# Patient Record
Sex: Male | Born: 1980 | Race: Black or African American | Hispanic: No | Marital: Single | Smoking: Never smoker
Health system: Southern US, Community
[De-identification: ages and names within clinical notes are randomized; demographics above are authoritative.]

## PROBLEM LIST (undated history)

## (undated) DIAGNOSIS — H547 Unspecified visual loss: Secondary | ICD-10-CM

## (undated) DIAGNOSIS — I10 Essential (primary) hypertension: Secondary | ICD-10-CM

## (undated) DIAGNOSIS — E119 Type 2 diabetes mellitus without complications: Secondary | ICD-10-CM

## (undated) HISTORY — PX: EYE SURGERY: SHX253

---

## 2017-12-24 ENCOUNTER — Emergency Department (HOSPITAL_BASED_OUTPATIENT_CLINIC_OR_DEPARTMENT_OTHER)
Admit: 2017-12-24 | Discharge: 2017-12-24 | Disposition: A | Discharge status: Home / Self Care | Payer: BLUE CROSS/BLUE SHIELD | Attending: Emergency Medicine | Admitting: Emergency Medicine

## 2017-12-24 ENCOUNTER — Encounter (HOSPITAL_COMMUNITY): Payer: Self-pay

## 2017-12-24 ENCOUNTER — Emergency Department (HOSPITAL_COMMUNITY)
Admission: EM | Admit: 2017-12-24 | Discharge: 2017-12-24 | Disposition: A | Discharge status: Home / Self Care | Payer: BLUE CROSS/BLUE SHIELD | Attending: Emergency Medicine | Admitting: Emergency Medicine

## 2017-12-24 ENCOUNTER — Emergency Department (HOSPITAL_COMMUNITY): Payer: BLUE CROSS/BLUE SHIELD

## 2017-12-24 DIAGNOSIS — R609 Edema, unspecified: Secondary | ICD-10-CM

## 2017-12-24 DIAGNOSIS — R2241 Localized swelling, mass and lump, right lower limb: Secondary | ICD-10-CM | POA: Insufficient documentation

## 2017-12-24 DIAGNOSIS — M79609 Pain in unspecified limb: Secondary | ICD-10-CM

## 2017-12-24 DIAGNOSIS — L03116 Cellulitis of left lower limb: Secondary | ICD-10-CM

## 2017-12-24 DIAGNOSIS — L03115 Cellulitis of right lower limb: Secondary | ICD-10-CM | POA: Insufficient documentation

## 2017-12-24 DIAGNOSIS — M25561 Pain in right knee: Secondary | ICD-10-CM | POA: Diagnosis present

## 2017-12-24 DIAGNOSIS — Z794 Long term (current) use of insulin: Secondary | ICD-10-CM | POA: Diagnosis not present

## 2017-12-24 DIAGNOSIS — E1165 Type 2 diabetes mellitus with hyperglycemia: Secondary | ICD-10-CM | POA: Diagnosis not present

## 2017-12-24 DIAGNOSIS — R739 Hyperglycemia, unspecified: Secondary | ICD-10-CM

## 2017-12-24 HISTORY — DX: Type 2 diabetes mellitus without complications: E11.9

## 2017-12-24 HISTORY — DX: Unspecified visual loss: H54.7

## 2017-12-24 LAB — BASIC METABOLIC PANEL
Anion gap: 13 (ref 5–15)
BUN: 15 mg/dL (ref 6–20)
CO2: 21 mmol/L — ABNORMAL LOW (ref 22–32)
Calcium: 10 mg/dL (ref 8.9–10.3)
Chloride: 100 mmol/L — ABNORMAL LOW (ref 101–111)
Creatinine, Ser: 1.2 mg/dL (ref 0.61–1.24)
GFR calc Af Amer: >60 mL/min (ref >60)
GFR calc non Af Amer: >60 mL/min (ref >60)
Glucose, Bld: 385 mg/dL — ABNORMAL HIGH (ref 65–99)
Potassium: 3.9 mmol/L (ref 3.5–5.1)
Sodium: 134 mmol/L — ABNORMAL LOW (ref 135–145)

## 2017-12-24 LAB — CBC WITH DIFFERENTIAL/PLATELET
Basophils Absolute: 0 10*3/uL (ref 0.0–0.1)
Basophils Relative: 0 %
Eosinophils Absolute: 0.1 10*3/uL (ref 0.0–0.7)
Eosinophils Relative: 1 %
HCT: 43.3 % (ref 39.0–52.0)
Hemoglobin: 14.4 g/dL (ref 13.0–17.0)
Lymphocytes Relative: 18 %
Lymphs Abs: 2.7 10*3/uL (ref 0.7–4.0)
MCH: 28 pg (ref 26.0–34.0)
MCHC: 33.3 g/dL (ref 30.0–36.0)
MCV: 84.2 fL (ref 78.0–100.0)
Monocytes Absolute: 0.7 10*3/uL (ref 0.1–1.0)
Monocytes Relative: 5 %
Neutro Abs: 11.1 10*3/uL — ABNORMAL HIGH (ref 1.7–7.7)
Neutrophils Relative %: 76 %
Platelets: 275 10*3/uL (ref 150–400)
RBC: 5.14 MIL/uL (ref 4.22–5.81)
RDW: 12 % (ref 11.5–15.5)
WBC: 14.6 10*3/uL — ABNORMAL HIGH (ref 4.0–10.5)

## 2017-12-24 LAB — CBG MONITORING, ED: Glucose-Capillary: 333 mg/dL — ABNORMAL HIGH (ref 65–99)

## 2017-12-24 LAB — I-STAT CG4 LACTIC ACID, ED: Lactic Acid, Venous: 1.44 mmol/L (ref 0.5–1.9)

## 2017-12-24 MED ORDER — SODIUM CHLORIDE 0.9 % IV BOLUS (SEPSIS)
1000.0000 mL | Freq: Once | INTRAVENOUS | Status: AC
  Administered 2017-12-24: 1000 mL via INTRAVENOUS

## 2017-12-24 MED ORDER — INSULIN GLARGINE 100 UNIT/ML ~~LOC~~ SOLN: 65.0000 [IU] | Freq: Every day | SUBCUTANEOUS | 11 refills | Status: AC

## 2017-12-24 MED ORDER — PRODIGY AUTOCODE BLOOD GLUCOSE W/DEVICE KIT: 1.0000 | PACK | Freq: Every day | 0 refills | Status: DC

## 2017-12-24 MED ORDER — DOXYCYCLINE HYCLATE 100 MG PO TABS
100.0000 mg | ORAL_TABLET | Freq: Once | ORAL | Status: AC
  Administered 2017-12-24: 100 mg via ORAL
  Filled 2017-12-24: qty 1

## 2017-12-24 MED ORDER — PRODIGY LANCETS 28G MISC: 0 refills | Status: DC

## 2017-12-24 MED ORDER — DOXYCYCLINE HYCLATE 100 MG PO CAPS: 100.0000 mg | ORAL_CAPSULE | Freq: Two times a day (BID) | ORAL | 0 refills | Status: DC

## 2017-12-24 NOTE — ED Triage Notes (Signed)
PT in X-Ray Dept

## 2017-12-24 NOTE — Discharge Instructions (Signed)
Please take all of your antibiotics until finished!   You may develop abdominal discomfort or diarrhea from the antibiotic.  You may help offset this with probiotics which you can buy or get in yogurt. Do not eat  or take the probiotics until 2 hours after your antibiotic.   You may alternate ibuprofen and Tylenol every 4 hours as needed for pain.  I have refilled your Lantus and given you a prescription for a Prodigy talking glucometer kit as well as lancets.  Establish care with a primary care physician as soon as possible for evaluation and management of your diabetes.  Return to the emergency department any concerning signs or symptoms develop such as fevers, worsening pain and swelling, or inability to move the knee.

## 2017-12-24 NOTE — ED Notes (Signed)
Declined W/C at D/C and was escorted to lobby by RN. 

## 2017-12-24 NOTE — ED Provider Notes (Signed)
[name] EMERGENCY DEPARTMENT Provider Note   CSN: [id number] Arrival date & time: 12/24/17  0815     History   Chief Complaint Chief Complaint  Patient presents with  . Leg Pain    HPI Cory Mullins is a 37 y.o. male with history of diabetes and blindness presents today for evaluation of acute onset, progressively worsening right knee pain for 1 week.  He states he noticed a "knot "to the lateral aspect of his right knee which intermittently drained fluid.  He notes swelling to the knee extending down to his ankle.  Denies ankle pain.  Pain in the knee is intermittent, aching in nature and worsens when he attempts to ambulate or leaves his knee in a flexed position for too long.  He does note subjective fevers and chills but did not take his temperature at home.  He is supposed to be on Lantus but has not had this medication for over 3 months since moving down to [place].  He does endorse polydipsia and polyuria.  He denies chest pain or shortness of breath.  No orthopnea.  Denies any recent trauma or falls.  The history is provided by the patient.    Past Medical History:  Diagnosis Date  . Blind   . Diabetes mellitus without complication ([place])     There are no active problems to display for this patient.   Past Surgical History:  Procedure Laterality Date  . EYE SURGERY         Home Medications    Prior to Admission medications   Medication Sig Start Date End Date Taking? Authorizing Provider  Blood Glucose Monitoring Suppl (PRODIGY AUTOCODE BLOOD GLUCOSE) w/Device KIT 1 Device by Does not apply route daily. 12/24/17   Sahaj Bona A, PA-C  doxycycline (VIBRAMYCIN) 100 MG capsule Take 1 capsule (100 mg total) by mouth 2 (two) times daily for 10 days. 12/24/17 01/03/18  Rodell Perna A, PA-C  insulin glargine (LANTUS) 100 UNIT/ML injection Inject 0.65 mLs (65 Units total) into the skin at bedtime. 12/24/17   Nils Flack, [place], PA-C  PRODIGY LANCETS 28G  MISC Use lancets to check blood glucose daily 12/24/17   Renita Papa, PA-C    Family History No family history on file.  Social History Social History   Tobacco Use  . Smoking status: Never Smoker  . Smokeless tobacco: Never Used  Substance Use Topics  . Alcohol use: No    Frequency: Never  . Drug use: No     Allergies   Patient has no known allergies.   Review of Systems Review of Systems  Constitutional: Positive for chills and fever.  Respiratory: Negative for shortness of breath.   Cardiovascular: Negative for chest pain.  Genitourinary: Negative for discharge, hematuria, penile pain, penile swelling, scrotal swelling, testicular pain and urgency.  Musculoskeletal: Positive for arthralgias.  Skin: Positive for wound.  Neurological: Negative for weakness and numbness.     Physical Exam Updated Vital Signs BP (!) 141/77 (BP Location: Right Arm)   Pulse 89   Temp 98.4 F (36.9 C) (Oral)   Resp 16   SpO2 100%   Physical Exam  Constitutional: He is oriented to person, place, and time. He appears well-developed and well-nourished. No distress.  Resting in chair, wearing dark sunglasses  HENT:  Head: Normocephalic and atraumatic.  Eyes: Conjunctivae are normal. Right eye exhibits no discharge. Left eye exhibits no discharge.  Neck: No JVD present. No tracheal deviation present.  Cardiovascular: Normal rate.  2+ DP/PT pulses bilaterally, 1+ nonpitting edema of the right lower extremity extending from the ankle up into the knee.  No calf tenderness, no palpable cords.  Right calf measures 44 cm at widest point as compared to 40 cm on left calf.  Pulmonary/Chest: Effort normal.  Abdominal: He exhibits no distension.  Musculoskeletal: Normal range of motion. He exhibits tenderness. He exhibits no edema.  Normal passive range of motion of the right knee.  There is mild swelling noted anteriorly.  There is a 1 cm nodule to the lateral aspect of the right knee with some  surrounding induration and mild erythema.  No drainage from this area.  This nodule is tender to palpation extending upwards into the lateral aspect of the thigh. No warmth noted to the joint.  Negative anterior/posterior drawer test.  No varus or valgus instability.  5/5 strength of BLE major muscle groups.  Neurological: He is alert and oriented to person, place, and time. No sensory deficit. He exhibits normal muscle tone.  Fluent speech, no facial droop, sensation intact to soft touch of extremities, initially ambulate with a mildly antalgic gait but then becomes normal, exhibits good balance, and patient able to heel walk and toe walk without difficulty.   Skin: Skin is warm and dry. No erythema.  Psychiatric: He has a normal mood and affect. His behavior is normal.  Nursing note and vitals reviewed.    ED Treatments / Results  Labs (all labs ordered are listed, but only abnormal results are displayed) Labs Reviewed  BASIC METABOLIC PANEL - Abnormal; Notable for the following components:      Result Value   Sodium 134 (*)    Chloride 100 (*)    CO2 21 (*)    Glucose, Bld 385 (*)    All other components within normal limits  CBC WITH DIFFERENTIAL/PLATELET - Abnormal; Notable for the following components:   WBC 14.6 (*)    Neutro Abs 11.1 (*)    All other components within normal limits  CBG MONITORING, ED - Abnormal; Notable for the following components:   Glucose-Capillary 333 (*)    All other components within normal limits  I-STAT CG4 LACTIC ACID, ED  I-STAT CG4 LACTIC ACID, ED    EKG  EKG Interpretation None       Radiology Dg Knee Complete 4 Views Right  Result Date: 12/24/2017 CLINICAL DATA:  Knee pain for 1-2 weeks. EXAM: RIGHT KNEE - COMPLETE 4+ VIEW COMPARISON:  None. FINDINGS: No fracture, dislocation, or joint effusion.  No bony erosion. IMPRESSION: Negative. Electronically Signed   By: Dorise Bullion III M.D   On: 12/24/2017 12:00     Procedures Procedures (including critical care time) EMERGENCY DEPARTMENT US SOFT TISSUE INTERPRETATION "Study: Limited Soft Tissue Ultrasound"  INDICATIONS: Pain and Soft tissue infection Multiple views of the body part were obtained in real-time with a multi-frequency linear probe  PERFORMED BY: Myself IMAGES ARCHIVED?: Yes SIDE:Right  BODY PART:Lower extremity INTERPRETATION:  Cellulitis present    Medications Ordered in ED Medications  sodium chloride 0.9 % bolus 1,000 mL (0 mLs Intravenous Stopped 12/24/17 1425)  doxycycline (VIBRA-TABS) tablet 100 mg (100 mg Oral Given 12/24/17 1425)     Initial Impression / Assessment and Plan / ED Course  I have reviewed the triage vital signs and the nursing notes.  Pertinent labs & imaging results that were available during my care of the patient were reviewed by me and considered in my medical  decision making (see chart for details).     Patient presents with complaint of pain to the lateral aspect of the right knee with some surrounding induration and lower extremity edema.  He is afebrile but tachycardic and hypertensive in the ED with improvement throughout his stay in the ED.  On examination he has nonpitting edema of the right lower extremity and a firm nodule to the lateral aspect of the right knee.  He has good passive range of motion of the knee with no erythema or warmth and I doubt septic joint or gout.  He is neurovascularly intact.  Radiographs show no evidence of acute osseous abnormality or severe swelling, no evidence of osteomyelitis.  Bedside ultrasound shows cobblestoning suggestive of cellulitis but no definitive fluid collection amenable to I&D.  He is ambulatory without difficulty.  DVT study was completed with no evidence of DVT but some inguinal lymphadenopathy.  Patient denies any pain or swelling to the scrotum/penis or any other pelvic complaints.  I doubt Fournier's gangrene.  He is diabetic and has not been on  his Lantus for 3 months since moving to [place] state.  He does state that he used to own a glucometer that would read out his blood sugar reading but left this in the other state.  Lactate is normal and he does not appear to be septic at this time.  He was hyperglycemic with a blood glucose of 385 and was given a liter of IV fluids.  No evidence of DKA and anion gap is normal.  He does have a leukocytosis with elevation in absolute neutrophils.    I suspect he has a cellulitis from what was initially an abscess that has since drained.  Will discharge with doxycycline.  We will also refill his Lantus and give him a prescription for the glucometer that can speak out his blood glucose readings.  He states that he is in the process of obtaining a PCP here.  I encouraged him to contact his insurance company and establish care with a PCP as soon as possible.  Discussed strict ED return precautions.  Patient and patient's fianc verbalized understanding of and agreement with plan and patient is stable for discharge home at this time.  Final Clinical Impressions(s) / ED Diagnoses   Final diagnoses:  Left leg cellulitis  Hyperglycemia    ED Discharge Orders        Ordered    doxycycline (VIBRAMYCIN) 100 MG capsule  2 times daily     12/24/17 1416    Blood Glucose Monitoring Suppl (PRODIGY AUTOCODE BLOOD GLUCOSE) w/Device KIT  Daily     12/24/17 1416    PRODIGY LANCETS 28G MISC     12/24/17 1416    insulin glargine (LANTUS) 100 UNIT/ML injection  Daily at bedtime     12/24/17 1416       Renita Papa, PA-C 12/24/17 1639    Julianne Rice, MD 12/25/17 [phone]

## 2017-12-24 NOTE — ED Triage Notes (Signed)
CBG 333. °

## 2017-12-24 NOTE — ED Triage Notes (Signed)
Pt reports swelling to right ankles and abscess to right knee x several weeks. Denies pain to ankle.

## 2017-12-24 NOTE — ED Triage Notes (Signed)
PT gone to US.

## 2017-12-24 NOTE — Progress Notes (Signed)
VASCULAR LAB PRELIMINARY  PRELIMINARY  PRELIMINARY  PRELIMINARY  Right lower extremity venous duplex completed.    Preliminary report:  There is no DVT or SVT noted in the right lower extremity.  Enlarged inguinal lymph nodes noted, bilaterally.   Gave report to Arthor CaptainAbigail Harris, PA-C  Serayah Yazdani, RVT 12/24/2017, 12:19 PM

## 2017-12-28 ENCOUNTER — Encounter (HOSPITAL_COMMUNITY): Payer: Self-pay | Admitting: *Deleted

## 2017-12-28 ENCOUNTER — Inpatient Hospital Stay (HOSPITAL_COMMUNITY)
Admission: EM | Admit: 2017-12-28 | Discharge: 2018-01-01 | DRG: 603 | Disposition: A | Discharge status: Home / Self Care | Payer: BLUE CROSS/BLUE SHIELD | Attending: Family Medicine | Admitting: Family Medicine

## 2017-12-28 DIAGNOSIS — Z79899 Other long term (current) drug therapy: Secondary | ICD-10-CM

## 2017-12-28 DIAGNOSIS — Z833 Family history of diabetes mellitus: Secondary | ICD-10-CM

## 2017-12-28 DIAGNOSIS — D649 Anemia, unspecified: Secondary | ICD-10-CM | POA: Diagnosis present

## 2017-12-28 DIAGNOSIS — E1165 Type 2 diabetes mellitus with hyperglycemia: Secondary | ICD-10-CM | POA: Diagnosis present

## 2017-12-28 DIAGNOSIS — Z794 Long term (current) use of insulin: Secondary | ICD-10-CM

## 2017-12-28 DIAGNOSIS — H547 Unspecified visual loss: Secondary | ICD-10-CM

## 2017-12-28 DIAGNOSIS — L03115 Cellulitis of right lower limb: Secondary | ICD-10-CM | POA: Diagnosis present

## 2017-12-28 DIAGNOSIS — L02415 Cutaneous abscess of right lower limb: Secondary | ICD-10-CM | POA: Diagnosis not present

## 2017-12-28 DIAGNOSIS — R739 Hyperglycemia, unspecified: Secondary | ICD-10-CM

## 2017-12-28 LAB — CBG MONITORING, ED: GLUCOSE-CAPILLARY: 323 mg/dL — AB (ref 65–99)

## 2017-12-28 MED ORDER — LIDOCAINE-EPINEPHRINE 2 %-1:200000 IJ SOLN
20.0000 mL | Freq: Once | INTRAMUSCULAR | Status: AC
Start: 2017-12-28 — End: 2017-12-29
  Administered 2017-12-29: 20 mL
  Filled 2017-12-28: qty 20

## 2017-12-28 MED ORDER — SODIUM CHLORIDE 0.9 % IV BOLUS (SEPSIS)
1000.0000 mL | Freq: Once | INTRAVENOUS | Status: AC
  Administered 2017-12-29: 1000 mL via INTRAVENOUS

## 2017-12-28 NOTE — ED Triage Notes (Signed)
To ED for re-eval of right knee abscess- seen on Sunday for same and given Doxy. States the pain is better and the swelling is better. Didn't have to lance due to draining. Pt also states he was prescribed insulin that needed to be drawn up instead of a prefilled pen. Pt is blind and unable to draw but is able to use pen by sound.

## 2017-12-28 NOTE — ED Provider Notes (Signed)
[facility] EMERGENCY DEPARTMENT Provider Note   CSN: [id number] Arrival date & time: 12/28/17  1739     History   Chief Complaint Chief Complaint  Patient presents with  . Knee Pain    HPI Cory Mullins is a 37 y.o. 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  Per record review, patient was evaluated on 12/24/2017 for this and found to have an area of cellulitis but no fluid collection for which I&D would be amenable.  He was given fluids in the emergency department and was found to have no evidence of DKA.  Additionally, he had no evidence of DVT on venous ultrasound.  He was discharged home with doxycycline and a prescription for insulin.  Patient reports that he was given Lantus vials and not the pen which is difficult for him to use due to his blindness.  He reports that he has had difficulty with his glucometer and therefore has not been checking his blood sugar recently.  Patient endorses intermittent chills at home which have improved since getting the antibiotic but have not resolved.  He has not measured his temperature at home.  Patient reports he has pain and "stiffness" when attempting to bend his right knee.  He has been using warm compresses at home and states there has been some persistent drainage for the last several days.  Also continues to endorse some polydipsia and some polyuria.   The history is provided by the patient, medical records and a significant other. No language interpreter was used.    Past Medical History:  Diagnosis Date  . Blind   . Diabetes mellitus without complication ([place])     There are no active problems to display for this patient.   Past Surgical History:  Procedure Laterality Date  . EYE SURGERY         Home Medications    Prior to Admission medications   Medication Sig Start Date  End Date Taking? Authorizing Provider  doxycycline (VIBRAMYCIN) 100 MG capsule Take 1 capsule (100 mg total) by mouth 2 (two) times daily for 10 days. 12/24/17 01/03/18 Yes Fawze, Mina A, PA-C  insulin glargine (LANTUS) 100 UNIT/ML injection Inject 0.65 mLs (65 Units total) into the skin at bedtime. 12/24/17  Yes Fawze, Mina A, PA-C  Blood Glucose Monitoring Suppl (PRODIGY AUTOCODE BLOOD GLUCOSE) w/Device KIT 1 Device by Does not apply route daily. 12/24/17   Nils Flack, [place], PA-C  PRODIGY LANCETS 28G MISC Use lancets to check blood glucose daily 12/24/17   Renita Papa, PA-C    Family History No family history on file.  Social History Social History   Tobacco Use  . Smoking status: Never Smoker  . Smokeless tobacco: Never Used  Substance Use Topics  . Alcohol use: No    Frequency: Never  . Drug use: No     Allergies   Patient has no known allergies.   Review of Systems Review of Systems  Constitutional: Positive for chills and fatigue. Negative for appetite change, diaphoresis, fever ( Patient has not attempted to measure at home) and unexpected weight change.  HENT: Negative for mouth sores.   Eyes: Negative for visual disturbance.  Respiratory: Negative for cough, chest tightness, shortness of breath and wheezing.   Cardiovascular: Negative for chest pain.  Gastrointestinal: Negative for abdominal pain, constipation, diarrhea, nausea and vomiting.  Endocrine: Positive for  polydipsia and polyuria. Negative for polyphagia.  Genitourinary: Negative for dysuria, frequency, hematuria and urgency.  Musculoskeletal: Positive for joint swelling ( Right knee). Negative for back pain and neck stiffness.  Skin: Positive for color change and wound. Negative for rash.  Allergic/Immunologic: Negative for immunocompromised state.  Neurological: Negative for syncope, light-headedness and headaches.  Hematological: Does not bruise/bleed easily.  Psychiatric/Behavioral: Negative for sleep  disturbance. The patient is not nervous/anxious.      Physical Exam Updated Vital Signs BP 133/89 (BP Location: Left Arm)   Pulse (!) 106   Temp 98.9 F (37.2 C) (Oral)   Resp 16   SpO2 100%   Physical Exam  Constitutional: He appears well-developed and well-nourished. No distress.  Awake, alert, nontoxic appearance  HENT:  Head: Normocephalic and atraumatic.  Mouth/Throat: Oropharynx is clear and moist. No oropharyngeal exudate.  Eyes: Conjunctivae are normal. No scleral icterus.  Neck: Normal range of motion. Neck supple.  Cardiovascular: Regular rhythm and intact distal pulses. Tachycardia present.  Pulses:      Radial pulses are 2+ on the right side, and 2+ on the left side.       Dorsalis pedis pulses are 2+ on the right side.  Pulmonary/Chest: Effort normal and breath sounds normal. No respiratory distress. He has no wheezes.  Equal chest expansion  Abdominal: Soft. Bowel sounds are normal. He exhibits no mass. There is no tenderness. There is no rebound and no guarding.  Musculoskeletal: Normal range of motion. He exhibits no edema.  Right knee with global edema, no circumferential warmth or redness.  No tenderness to palpation along the medial or posterior portion of the knee.  Patient able to slowly flex to approximately 90 degrees but unable to go further due to some pain and feeling of tightness.  Popliteal fossa is soft.  Neurological: He is alert.  Speech is clear and goal oriented Moves extremities without ataxia  Skin: Skin is warm and dry. He is not diaphoretic.  Large area of erythema and induration to the lateral portion of the right knee with central fluctuance and persistent purulent drainage.  Tenderness to palpation throughout.  Psychiatric: He has a normal mood and affect.  Nursing note and vitals reviewed.      ED Treatments / Results  Labs (all labs ordered are listed, but only abnormal results are displayed) Labs Reviewed  CBC WITH  DIFFERENTIAL/PLATELET - Abnormal; Notable for the following components:      Result Value   WBC 17.6 (*)    RBC 4.21 (*)    Hemoglobin 11.9 (*)    HCT 35.7 (*)    Neutro Abs 13.7 (*)    All other components within normal limits  BASIC METABOLIC PANEL - Abnormal; Notable for the following components:   Sodium 134 (*)    CO2 21 (*)    Glucose, Bld 414 (*)    All other components within normal limits  CBG MONITORING, ED - Abnormal; Notable for the following components:   Glucose-Capillary 323 (*)    All other components within normal limits  CBG MONITORING, ED - Abnormal; Notable for the following components:   Glucose-Capillary 293 (*)    All other components within normal limits  I-STAT CG4 LACTIC ACID, ED  I-STAT CG4 LACTIC ACID, ED    Procedures .Marland KitchenIncision and Drainage Date/Time: 12/29/2017 1:15 AM Performed by: Abigail Butts, PA-C Authorized by: Abigail Butts, PA-C   Consent:    Consent obtained:  Verbal   Consent given by:  Patient   Risks discussed:  Bleeding, incomplete drainage and infection   Alternatives discussed:  No treatment Location:    Type:  Abscess   Location:  Lower extremity   Lower extremity location:  Knee   Knee location:  R knee Pre-procedure details:    Skin preparation:  Betadine Anesthesia (see MAR for exact dosages):    Anesthesia method:  Local infiltration   Local anesthetic:  Lidocaine 2% WITH epi (50m) Procedure type:    Complexity:  Complex Procedure details:    Incision types:  Single straight   Incision depth:  Dermal   Scalpel blade:  11   Wound management:  Probed and deloculated, irrigated with saline and extensive cleaning   Drainage:  Bloody and purulent   Drainage amount:  Moderate   Wound treatment:  Wound left open   Packing materials:  None Post-procedure details:    Patient tolerance of procedure:  Tolerated well, no immediate complications   (including critical care time)  EMERGENCY DEPARTMENT [place]  TISSUE INTERPRETATION "Study: Limited Soft Tissue Ultrasound"  INDICATIONS: Pain and Soft tissue infection Multiple views of the body part were obtained in real-time with a multi-frequency linear probe  PERFORMED BY: Myself IMAGES ARCHIVED?: Yes SIDE:Right  BODY PART:right knee INTERPRETATION:  Abcess present and Cellulitis present     Medications Ordered in ED Medications  ceFAZolin (ANCEF) IVPB 1 g/50 mL premix (1 g Intravenous New Bag/Given 12/29/17 0156)  sodium chloride 0.9 % bolus 1,000 mL (0 mLs Intravenous Stopped 12/29/17 0150)  lidocaine-EPINEPHrine (XYLOCAINE W/EPI) 2 %-1:200000 (PF) injection 20 mL (20 mLs Infiltration Given 12/29/17 0009)  HYDROcodone-acetaminophen (NORCO/VICODIN) 5-325 MG per tablet 2 tablet (2 tablets Oral Given 12/29/17 0155)     Initial Impression / Assessment and Plan / ED Course  I have reviewed the triage vital signs and the nursing notes.  Pertinent labs & imaging results that were available during my care of the patient were reviewed by me and considered in my medical decision making (see chart for details).  Clinical Course as of Dec 29 205  Thu Dec 28, 2017  2339 Mild tachycardia  Pulse Rate(!): 106 [HM]  2339 Pt checked his CBG on his home machine during exam with a reading of 502   [HM]  Fri Dec 29, 2017  0117 Normal Lactic acid.  No evidence of sepsis  Lactic Acid, Venous: 1.05 [HM]  0126 Increased since last visit  WBC(!): 17.6 [HM]  0126 Decrease since last visit.  Patient denies known bleeding.  Hemoglobin(!): 11.9 [HM]  0127 Improved slightly.  Prior to fluids  Glucose(!): 414 [HM]  0206 Discussed with Dr. KHal Hopewho will admit   [HM]    Clinical Course User Index [HM] Gwenn Teodoro, HGwenlyn Perking   Presents with right knee abscess.  Patient has been at home with doxycycline for the last 4 days with questionable improvement.  He is blind and has been struggling with wound care and blood sugars at home along with  insulin administration.  Incision and drainage at bedside with moderate amount of purulent drainage.  Tachycardic on arrival but afebrile.  Suspect this is secondary to elevated glucose.  Lactic acid normal.  Patient given fluids.  He will need admission for IV antibiotics and further monitoring of the wound.  Given Ancef.    The patient was discussed with and seen by Dr. WLeonides Schanzwho agrees with the treatment plan.   Final Clinical Impressions(s) / ED Diagnoses   Final diagnoses:  Abscess  of right knee  Hyperglycemia  Anemia, unspecified type    ED Discharge Orders    None       Agapito Games 12/29/17 0207

## 2017-12-29 ENCOUNTER — Inpatient Hospital Stay (HOSPITAL_COMMUNITY): Payer: BLUE CROSS/BLUE SHIELD

## 2017-12-29 ENCOUNTER — Encounter (HOSPITAL_COMMUNITY): Payer: Self-pay | Admitting: Internal Medicine

## 2017-12-29 DIAGNOSIS — D649 Anemia, unspecified: Secondary | ICD-10-CM | POA: Diagnosis present

## 2017-12-29 DIAGNOSIS — E1165 Type 2 diabetes mellitus with hyperglycemia: Secondary | ICD-10-CM | POA: Diagnosis present

## 2017-12-29 DIAGNOSIS — Z79899 Other long term (current) drug therapy: Secondary | ICD-10-CM | POA: Diagnosis not present

## 2017-12-29 DIAGNOSIS — L02415 Cutaneous abscess of right lower limb: Secondary | ICD-10-CM | POA: Diagnosis present

## 2017-12-29 DIAGNOSIS — H547 Unspecified visual loss: Secondary | ICD-10-CM | POA: Diagnosis present

## 2017-12-29 DIAGNOSIS — L03115 Cellulitis of right lower limb: Secondary | ICD-10-CM

## 2017-12-29 DIAGNOSIS — Z794 Long term (current) use of insulin: Secondary | ICD-10-CM | POA: Diagnosis not present

## 2017-12-29 DIAGNOSIS — R739 Hyperglycemia, unspecified: Secondary | ICD-10-CM | POA: Diagnosis present

## 2017-12-29 DIAGNOSIS — Z833 Family history of diabetes mellitus: Secondary | ICD-10-CM | POA: Diagnosis not present

## 2017-12-29 LAB — BASIC METABOLIC PANEL
ANION GAP: 11 (ref 5–15)
Anion gap: 10 (ref 5–15)
BUN: 17 mg/dL (ref 6–20)
BUN: 16 mg/dL (ref 6–20)
CALCIUM: 8.8 mg/dL — AB (ref 8.9–10.3)
CHLORIDE: 102 mmol/L (ref 101–111)
CO2: 21 mmol/L — ABNORMAL LOW (ref 22–32)
CO2: 22 mmol/L (ref 22–32)
CREATININE: 0.99 mg/dL (ref 0.61–1.24)
Calcium: 9 mg/dL (ref 8.9–10.3)
Chloride: 104 mmol/L (ref 101–111)
Creatinine, Ser: 1.19 mg/dL (ref 0.61–1.24)
GFR calc Af Amer: >60 mL/min (ref >60)
GFR calc non Af Amer: >60 mL/min (ref >60)
GLUCOSE: 296 mg/dL — AB (ref 65–99)
Glucose, Bld: 414 mg/dL — ABNORMAL HIGH (ref 65–99)
POTASSIUM: 3.8 mmol/L (ref 3.5–5.1)
Potassium: 3.7 mmol/L (ref 3.5–5.1)
SODIUM: 134 mmol/L — AB (ref 135–145)
Sodium: 136 mmol/L (ref 135–145)

## 2017-12-29 LAB — CBC WITH DIFFERENTIAL/PLATELET
BASOS PCT: 0 %
Basophils Absolute: 0 10*3/uL (ref 0.0–0.1)
EOS ABS: 0.1 10*3/uL (ref 0.0–0.7)
Eosinophils Relative: 1 %
HCT: 35.7 % — ABNORMAL LOW (ref 39.0–52.0)
HEMOGLOBIN: 11.9 g/dL — AB (ref 13.0–17.0)
LYMPHS ABS: 2.8 10*3/uL (ref 0.7–4.0)
Lymphocytes Relative: 16 %
MCH: 28.3 pg (ref 26.0–34.0)
MCHC: 33.3 g/dL (ref 30.0–36.0)
MCV: 84.8 fL (ref 78.0–100.0)
Monocytes Absolute: 1 10*3/uL (ref 0.1–1.0)
Monocytes Relative: 6 %
NEUTROS PCT: 77 %
Neutro Abs: 13.7 10*3/uL — ABNORMAL HIGH (ref 1.7–7.7)
PLATELETS: 323 10*3/uL (ref 150–400)
RBC: 4.21 MIL/uL — AB (ref 4.22–5.81)
RDW: 12.2 % (ref 11.5–15.5)
WBC: 17.6 10*3/uL — AB (ref 4.0–10.5)

## 2017-12-29 LAB — CBC
HCT: 33.4 % — ABNORMAL LOW (ref 39.0–52.0)
HEMOGLOBIN: 11 g/dL — AB (ref 13.0–17.0)
MCH: 27.7 pg (ref 26.0–34.0)
MCHC: 32.9 g/dL (ref 30.0–36.0)
MCV: 84.1 fL (ref 78.0–100.0)
Platelets: 319 10*3/uL (ref 150–400)
RBC: 3.97 MIL/uL — ABNORMAL LOW (ref 4.22–5.81)
RDW: 11.9 % (ref 11.5–15.5)
WBC: 15.3 10*3/uL — ABNORMAL HIGH (ref 4.0–10.5)

## 2017-12-29 LAB — GLUCOSE, CAPILLARY
GLUCOSE-CAPILLARY: 226 mg/dL — AB (ref 65–99)
Glucose-Capillary: 256 mg/dL — ABNORMAL HIGH (ref 65–99)
Glucose-Capillary: 234 mg/dL — ABNORMAL HIGH (ref 65–99)
Glucose-Capillary: 207 mg/dL — ABNORMAL HIGH (ref 65–99)

## 2017-12-29 LAB — I-STAT CG4 LACTIC ACID, ED
LACTIC ACID, VENOUS: 0.73 mmol/L (ref 0.5–1.9)
LACTIC ACID, VENOUS: 1.05 mmol/L (ref 0.5–1.9)

## 2017-12-29 LAB — HEMOGLOBIN A1C
HEMOGLOBIN A1C: 11.5 % — AB (ref 4.8–5.6)
Mean Plasma Glucose: 283.35 mg/dL

## 2017-12-29 LAB — CBG MONITORING, ED: Glucose-Capillary: 293 mg/dL — ABNORMAL HIGH (ref 65–99)

## 2017-12-29 LAB — HIV ANTIBODY (ROUTINE TESTING W REFLEX): HIV Screen 4th Generation wRfx: NONREACTIVE

## 2017-12-29 MED ORDER — ENOXAPARIN SODIUM 60 MG/0.6ML ~~LOC~~ SOLN
50.0000 mg | Freq: Every day | SUBCUTANEOUS | Status: DC
Start: 2017-12-29 — End: 2018-01-01
  Administered 2017-12-29 – 2018-01-01 (×4): 50 mg via SUBCUTANEOUS
  Filled 2017-12-29 (×4): qty 0.6

## 2017-12-29 MED ORDER — ACETAMINOPHEN 650 MG RE SUPP: 650.0000 mg | Freq: Four times a day (QID) | RECTAL | Status: DC | PRN

## 2017-12-29 MED ORDER — INSULIN ASPART 100 UNIT/ML ~~LOC~~ SOLN
0.0000 [IU] | Freq: Every day | SUBCUTANEOUS | Status: DC
  Administered 2017-12-29: 2 [IU] via SUBCUTANEOUS

## 2017-12-29 MED ORDER — ONDANSETRON HCL 4 MG PO TABS: 4.0000 mg | ORAL_TABLET | Freq: Four times a day (QID) | ORAL | Status: DC | PRN

## 2017-12-29 MED ORDER — INSULIN GLARGINE 100 UNIT/ML ~~LOC~~ SOLN
65.0000 [IU] | Freq: Every day | SUBCUTANEOUS | Status: DC
  Administered 2017-12-29 – 2018-01-01 (×4): 65 [IU] via SUBCUTANEOUS
  Filled 2017-12-29 (×5): qty 0.65

## 2017-12-29 MED ORDER — HYDROCODONE-ACETAMINOPHEN 5-325 MG PO TABS
1.0000 | ORAL_TABLET | Freq: Four times a day (QID) | ORAL | Status: DC | PRN
Start: 2017-12-29 — End: 2018-01-01
  Administered 2017-12-29 – 2017-12-30 (×2): 1 via ORAL
  Administered 2017-12-30 – 2017-12-31 (×2): 2 via ORAL
  Filled 2017-12-29: qty 2
  Filled 2017-12-29 (×2): qty 1
  Filled 2017-12-29: qty 2

## 2017-12-29 MED ORDER — PIPERACILLIN-TAZOBACTAM 3.375 G IVPB
3.3750 g | Freq: Three times a day (TID) | INTRAVENOUS | Status: DC
  Administered 2017-12-29 – 2018-01-01 (×9): 3.375 g via INTRAVENOUS
  Filled 2017-12-29 (×10): qty 50

## 2017-12-29 MED ORDER — INSULIN ASPART 100 UNIT/ML ~~LOC~~ SOLN
0.0000 [IU] | Freq: Three times a day (TID) | SUBCUTANEOUS | Status: DC
  Administered 2017-12-29: 5 [IU] via SUBCUTANEOUS
  Administered 2017-12-29: 3 [IU] via SUBCUTANEOUS

## 2017-12-29 MED ORDER — VANCOMYCIN HCL IN DEXTROSE 1-5 GM/200ML-% IV SOLN
1000.0000 mg | Freq: Three times a day (TID) | INTRAVENOUS | Status: DC
Start: 2017-12-29 — End: 2018-01-01
  Administered 2017-12-29 – 2018-01-01 (×9): 1000 mg via INTRAVENOUS
  Filled 2017-12-29 (×10): qty 200

## 2017-12-29 MED ORDER — ONDANSETRON HCL 4 MG/2ML IJ SOLN: 4.0000 mg | Freq: Four times a day (QID) | INTRAMUSCULAR | Status: DC | PRN

## 2017-12-29 MED ORDER — HYDROCODONE-ACETAMINOPHEN 5-325 MG PO TABS
2.0000 | ORAL_TABLET | Freq: Once | ORAL | Status: AC
  Administered 2017-12-29: 2 via ORAL
  Filled 2017-12-29: qty 2

## 2017-12-29 MED ORDER — PIPERACILLIN-TAZOBACTAM 3.375 G IVPB 30 MIN
3.3750 g | Freq: Once | INTRAVENOUS | Status: AC
  Administered 2017-12-29: 3.375 g via INTRAVENOUS
  Filled 2017-12-29: qty 50

## 2017-12-29 MED ORDER — CEFAZOLIN SODIUM-DEXTROSE 1-4 GM/50ML-% IV SOLN
1.0000 g | Freq: Once | INTRAVENOUS | Status: AC
  Administered 2017-12-29: 1 g via INTRAVENOUS
  Filled 2017-12-29: qty 50

## 2017-12-29 MED ORDER — INSULIN ASPART 100 UNIT/ML ~~LOC~~ SOLN
0.0000 [IU] | Freq: Three times a day (TID) | SUBCUTANEOUS | Status: DC
  Administered 2017-12-29: 5 [IU] via SUBCUTANEOUS
  Administered 2017-12-30: 3 [IU] via SUBCUTANEOUS
  Administered 2017-12-30 (×2): 5 [IU] via SUBCUTANEOUS
  Administered 2017-12-31 (×3): 3 [IU] via SUBCUTANEOUS
  Administered 2018-01-01 (×2): 5 [IU] via SUBCUTANEOUS

## 2017-12-29 MED ORDER — SODIUM CHLORIDE 0.9 % IV SOLN
INTRAVENOUS | Status: DC
  Administered 2017-12-29: 05:00:00 via INTRAVENOUS

## 2017-12-29 MED ORDER — ACETAMINOPHEN 325 MG PO TABS
650.0000 mg | ORAL_TABLET | Freq: Four times a day (QID) | ORAL | Status: DC | PRN
  Administered 2017-12-29 – 2017-12-31 (×5): 650 mg via ORAL
  Filled 2017-12-29 (×5): qty 2

## 2017-12-29 MED ORDER — SODIUM CHLORIDE 0.9 % IV SOLN
INTRAVENOUS | Status: DC
  Administered 2017-12-29: 18:00:00 via INTRAVENOUS

## 2017-12-29 MED ORDER — VANCOMYCIN HCL IN DEXTROSE 1-5 GM/200ML-% IV SOLN
1000.0000 mg | Freq: Once | INTRAVENOUS | Status: AC
  Administered 2017-12-29: 1000 mg via INTRAVENOUS
  Filled 2017-12-29: qty 200

## 2017-12-29 MED ORDER — VANCOMYCIN HCL 10 G IV SOLR
1250.0000 mg | Freq: Two times a day (BID) | INTRAVENOUS | Status: DC
  Filled 2017-12-29: qty 1250

## 2017-12-29 NOTE — Consult Note (Signed)
Reason for Consult:Knee infection Referring Physician: A Hongalgi  Cory Mullins is an 37 y.o. male with DM. HPI: Cory Mullins developed a knot on his right lateral knee about 10d ago. It gradually worsened with pain, especially when bearing weight. He came to the ED over the weekend and was given oral abx. He continued to worsen and came back to the ED yesterday. An I&D was performed and he was admitted for IV abx. He denies any similar episodes.  Past Medical History:  Diagnosis Date  . Blind   . Diabetes mellitus without complication [facility])     Past Surgical History:  Procedure Laterality Date  . EYE SURGERY      Family History  Problem Relation Age of Onset  . Diabetes Mellitus II Father     Social History:  reports that he has never smoked. He has never used smokeless tobacco. He reports that he does not drink alcohol or use drugs.  Allergies: No Known Allergies  Medications: I have reviewed the patient's current medications.  Results for orders placed or performed during the hospital encounter of 12/28/17 (from the past 48 hour(s))  POC CBG, ED     Status: Abnormal   Collection Time: 12/28/17  6:31 PM  Result Value Ref Range   Glucose-Capillary 323 (H) 65 - 99 mg/dL  CBC with Differential     Status: Abnormal   Collection Time: 12/29/17 12:08 AM  Result Value Ref Range   WBC 17.6 (H) 4.0 - 10.5 K/uL   RBC 4.21 (L) 4.22 - 5.81 MIL/uL   Hemoglobin 11.9 (L) 13.0 - 17.0 g/dL   HCT 35.7 (L) 39.0 - 52.0 %   MCV 84.8 78.0 - 100.0 fL   MCH 28.3 26.0 - 34.0 pg   MCHC 33.3 30.0 - 36.0 g/dL   RDW 12.2 11.5 - 15.5 %   Platelets 323 150 - 400 K/uL   Neutrophils Relative % 77 %   Neutro Abs 13.7 (H) 1.7 - 7.7 K/uL   Lymphocytes Relative 16 %   Lymphs Abs 2.8 0.7 - 4.0 K/uL   Monocytes Relative 6 %   Monocytes Absolute 1.0 0.1 - 1.0 K/uL   Eosinophils Relative 1 %   Eosinophils Absolute 0.1 0.0 - 0.7 K/uL   Basophils Relative 0 %   Basophils Absolute 0.0 0.0 - 0.1 K/uL     Comment: Performed at [facility], [address] [place], [place] [address]  Basic metabolic panel     Status: Abnormal   Collection Time: 12/29/17 12:08 AM  Result Value Ref Range   Sodium 134 (L) 135 - 145 mmol/L   Potassium 3.8 3.5 - 5.1 mmol/L   Chloride 102 101 - 111 mmol/L   CO2 21 (L) 22 - 32 mmol/L   Glucose, Bld 414 (H) 65 - 99 mg/dL   BUN 17 6 - 20 mg/dL   Creatinine, Ser 1.19 0.61 - 1.24 mg/dL   Calcium 9.0 8.9 - 10.3 mg/dL   GFR calc non Af Amer >60 >60 mL/min   GFR calc Af Amer >60 >60 mL/min    Comment: (NOTE) The eGFR has been calculated using the CKD EPI equation. This calculation has not been validated in all clinical situations. eGFR's persistently <60 mL/min signify possible Chronic Kidney Disease.    Anion gap 11 5 - 15    Comment: Performed at [facility] [address] [place],  [address]  I-Stat CG4 Lactic Acid, ED     Status:  None   Collection Time: 12/29/17 12:14 AM  Result Value Ref Range   Lactic Acid, Venous 1.05 0.5 - 1.9 mmol/L  POC CBG, ED     Status: Abnormal   Collection Time: 12/29/17  1:55 AM  Result Value Ref Range   Glucose-Capillary 293 (H) 65 - 99 mg/dL  I-Stat CG4 Lactic Acid, ED     Status: None   Collection Time: 12/29/17  2:02 AM  Result Value Ref Range   Lactic Acid, Venous 0.73 0.5 - 1.9 mmol/L  Basic metabolic panel     Status: Abnormal   Collection Time: 12/29/17  4:45 AM  Result Value Ref Range   Sodium 136 135 - 145 mmol/L   Potassium 3.7 3.5 - 5.1 mmol/L   Chloride 104 101 - 111 mmol/L   CO2 22 22 - 32 mmol/L   Glucose, Bld 296 (H) 65 - 99 mg/dL   BUN 16 6 - 20 mg/dL   Creatinine, Ser 0.99 0.61 - 1.24 mg/dL   Calcium 8.8 (L) 8.9 - 10.3 mg/dL   GFR calc non Af Amer >60 >60 mL/min   GFR calc Af Amer >60 >60 mL/min    Comment: (NOTE) The eGFR has been calculated using the CKD EPI equation. This calculation has not been validated in all clinical situations. eGFR's persistently <60 mL/min signify  possible Chronic Kidney Disease.    Anion gap 10 5 - 15    Comment: Performed at [facility] [address] [place], [place] [address]  CBC     Status: Abnormal   Collection Time: 12/29/17  4:45 AM  Result Value Ref Range   WBC 15.3 (H) 4.0 - 10.5 K/uL   RBC 3.97 (L) 4.22 - 5.81 MIL/uL   Hemoglobin 11.0 (L) 13.0 - 17.0 g/dL   HCT 33.4 (L) 39.0 - 52.0 %   MCV 84.1 78.0 - 100.0 fL   MCH 27.7 26.0 - 34.0 pg   MCHC 32.9 30.0 - 36.0 g/dL   RDW 11.9 11.5 - 15.5 %   Platelets 319 150 - 400 K/uL    Comment: Performed at [facility], [place] [address], [place], [place] [address]  Glucose, capillary     Status: Abnormal   Collection Time: 12/29/17  8:12 AM  Result Value Ref Range   Glucose-Capillary 256 (H) 65 - 99 mg/dL    Ct Knee Right Wo Contrast  Result Date: 12/29/2017 CLINICAL DATA:  Knee pain.  Lateral right knee abscess. EXAM: CT OF THE RIGHT KNEE WITHOUT CONTRAST TECHNIQUE: Multidetector CT imaging of the RIGHT knee was performed according to the standard protocol. Multiplanar CT image reconstructions were also generated. COMPARISON:  Radiographs 12/24/2017 FINDINGS: Bones/Joint/Cartilage No fracture. Joint spaces are maintained. No periosteal reaction or bony destructive change. There is a small knee joint effusion. Ligaments Suboptimally assessed by CT. Anterior and posterior cruciate ligament fibers are visualized. Muscles and Tendons No evidence of intramuscular abscess on noncontrast exam. Muscle bulk is maintained. Soft tissues Skin thickening and subcutaneous edema about the lateral knee. Small skin defect and small locules of air in the subcutaneous tissues likely related to recent incision and drainage. No well-defined fluid collection. There are varicosities in the included lower leg. IMPRESSION: 1. Skin thickening and soft tissue edema of the lateral knee without focal fluid collection/abscess. Small skin defect and small air locules in the subcutaneous tissues likely  related to recent incision and drainage. 2. Small right knee joint effusion, nonspecific, may be reactive. If  there is clinical concern for infected joint fluid, recommend fluid sampling. No CT findings of osteomyelitis. Electronically Signed   By: Jeb Levering M.D.   On: 12/29/2017 04:36    Review of Systems  Constitutional: Negative for weight loss.  HENT: Negative for ear discharge, ear pain, hearing loss and tinnitus.   Eyes: Negative for blurred vision, double vision, photophobia and pain.  Respiratory: Negative for cough, sputum production and shortness of breath.   Cardiovascular: Negative for chest pain.  Gastrointestinal: Negative for abdominal pain, nausea and vomiting.  Genitourinary: Negative for dysuria, flank pain, frequency and urgency.  Musculoskeletal: Positive for joint pain (Right knee). Negative for back pain, falls, myalgias and neck pain.  Neurological: Negative for dizziness, tingling, sensory change, focal weakness, loss of consciousness and headaches.  Endo/Heme/Allergies: Does not bruise/bleed easily.  Psychiatric/Behavioral: Negative for depression, memory loss and substance abuse. The patient is not nervous/anxious.    Blood pressure (!) 172/92, pulse 94, temperature 98.5 F (36.9 C), temperature source Oral, resp. rate 16, height 5' 6"  (1.676 m), weight 99.8 kg (220 lb), SpO2 99 %. Physical Exam  Constitutional: He appears well-developed and well-nourished. No distress.  HENT:  Head: Normocephalic and atraumatic.  Eyes: Conjunctivae are normal. Right eye exhibits no discharge. Left eye exhibits no discharge. No scleral icterus.  Neck: Normal range of motion.  Cardiovascular: Normal rate and regular rhythm.  Respiratory: Effort normal. No respiratory distress.  Musculoskeletal:  RLE No traumatic wounds, ecchymosis, or rash  Small incision posterolateral knee actively draining purulent fluid, mod TTP, no erythema. ROM 180 - 135 degrees  No knee or ankle  effusion  Knee stable to varus/ valgus and anterior/posterior stress  Sens DPN, SPN, TN intact  Motor EHL, ext, flex, evers 5/5  DP 2+, PT 0, No significant edema  Neurological: He is alert.  Skin: Skin is warm and dry. He is not diaphoretic.  Psychiatric: He has a normal mood and affect. His behavior is normal.    Assessment/Plan: Right knee infection -- He has decent range of motion so I don't think this infection involves the joint. His I&D incision is smaller than I would like but as it seems it's adequately draining the area would not extend it at this time. If it closes prematurely (as I expect it might) then may need to redo. Continue IV abx and wait for culture results. He may WBAT.    Lisette Abu, PA-C Orthopedic Surgery [phone] 12/29/2017, 10:16 AM

## 2017-12-29 NOTE — H&P (Signed)
History and Physical    Cory Mullins DVV:[id number] DOB: [birth year] DOA: 12/28/2017  PCP: Patient, No Pcp Per  Patient coming from: Home.  Chief Complaint: Right leg pain and swelling.  HPI: Cory Mullins is Mullins 37 y.o. male with history of diabetes mellitus, blindness had come to the ER on December 24, 2017 5 days ago with increasing pain and swelling in the right knee area.  Patient at the time was discharged on doxycycline.  Patient also was found to be hyperglycemic with known history of diabetes mellitus.  Patient states he has been taking his medications including insulin and doxycycline despite which his swelling has been worsening with increasing pain and finding it difficult to flex his knee.  ED Course: In the ER today labs show leukocytosis.  Patient had incision and drainage of the right lower extremity around the knee area and was sent for cultures and started on antibiotics.  Since patient's symptoms are worsening and has worsening hyperglycemia patient admitted for further observation.  Review of Systems: As per HPI, rest all negative.   Past Medical History:  Diagnosis Date  . Blind   . Diabetes mellitus without complication [facility])     Past Surgical History:  Procedure Laterality Date  . EYE SURGERY       reports that he has never smoked. He has never used smokeless tobacco. He reports that he does not drink alcohol or use drugs.  No Known Allergies  Family History  Problem Relation Age of Onset  . Diabetes Mellitus II Father     Prior to Admission medications   Medication Sig Start Date End Date Taking? Authorizing Provider  doxycycline (VIBRAMYCIN) 100 MG capsule Take 1 capsule (100 mg total) by mouth 2 (two) times daily for 10 days. 12/24/17 01/03/18 Yes Fawze, Mina A, PA-C  insulin glargine (LANTUS) 100 UNIT/ML injection Inject 0.65 mLs (65 Units total) into the skin at bedtime. 12/24/17  Yes Fawze, Mina A, PA-C  Blood Glucose Monitoring Suppl (PRODIGY AUTOCODE  BLOOD GLUCOSE) w/Device KIT 1 Device by Does not apply route daily. 12/24/17   Fawze, Mina A, PA-C  PRODIGY LANCETS 28G MISC Use lancets to check blood glucose daily 12/24/17   Cory Mullins, [place]    Physical Exam: Vitals:   12/28/17 1824 12/28/17 2126 12/29/17 0150  BP: 137/84 133/89 (!) 151/83  Pulse: (!) 103 (!) 106 98  Resp: 16 16 16   Temp: 98.9 F (37.2 C)  99.2 F (37.3 C)  TempSrc: Oral  Oral  SpO2: 100% 100% 97%      Constitutional: Moderately built and nourished. Vitals:   12/28/17 1824 12/28/17 2126 12/29/17 0150  BP: 137/84 133/89 (!) 151/83  Pulse: (!) 103 (!) 106 98  Resp: 16 16 16   Temp: 98.9 F (37.2 C)  99.2 F (37.3 C)  TempSrc: Oral  Oral  SpO2: 100% 100% 97%   Eyes: Anicteric no pallor.  Opaque cornea. ENMT: No discharge from the ears eyes nose or mouth. Neck: No mass palpated no JVD appreciated. Respiratory: No rhonchi or crepitations. Cardiovascular: S1-S2 heard no murmurs appreciated. Abdomen: Soft nontender bowel sounds present. Musculoskeletal: Right lower extremity around the knee and distal thigh area is tender to touch and difficulty to flex completely. Skin: Right lower extremity skin around the thigh distally is tender.  With incised and drainage. Neurologic: Alert awake oriented to time place and person.  Moves all extremities blind in both eyes. Psychiatric: Appears normal.   Labs on Admission: I have  personally reviewed following labs and imaging studies  CBC: Recent Labs  Lab 12/24/17 1223 12/29/17 0008  WBC 14.6* 17.6*  NEUTROABS 11.1* 13.7*  HGB 14.4 11.9*  HCT 43.3 35.7*  MCV 84.2 84.8  PLT 275 820   Basic Metabolic Panel: Recent Labs  Lab 12/24/17 1223 12/29/17 0008  NA 134* 134*  K 3.9 3.8  CL 100* 102  CO2 21* 21*  GLUCOSE 385* 414*  BUN 15 17  CREATININE 1.20 1.19  CALCIUM 10.0 9.0   GFR: CrCl cannot be calculated (Unknown ideal weight.). Liver Function Tests: No results for input(s): AST, ALT, ALKPHOS,  BILITOT, PROT, ALBUMIN in the last 168 hours. No results for input(s): LIPASE, AMYLASE in the last 168 hours. No results for input(s): AMMONIA in the last 168 hours. Coagulation Profile: No results for input(s): INR, PROTIME in the last 168 hours. Cardiac Enzymes: No results for input(s): CKTOTAL, CKMB, CKMBINDEX, TROPONINI in the last 168 hours. BNP (last 3 results) No results for input(s): PROBNP in the last 8760 hours. HbA1C: No results for input(s): HGBA1C in the last 72 hours. CBG: Recent Labs  Lab 12/24/17 1305 12/28/17 1831 12/29/17 0155  GLUCAP 333* 323* 293*   Lipid Profile: No results for input(s): CHOL, HDL, LDLCALC, TRIG, CHOLHDL, LDLDIRECT in the last 72 hours. Thyroid Function Tests: No results for input(s): TSH, T4TOTAL, FREET4, T3FREE, THYROIDAB in the last 72 hours. Anemia Panel: No results for input(s): VITAMINB12, FOLATE, FERRITIN, TIBC, IRON, RETICCTPCT in the last 72 hours. Urine analysis: No results found for: COLORURINE, APPEARANCEUR, LABSPEC, PHURINE, GLUCOSEU, HGBUR, BILIRUBINUR, KETONESUR, PROTEINUR, UROBILINOGEN, NITRITE, LEUKOCYTESUR Sepsis Labs: @LABRCNTIP (procalcitonin:4,lacticidven:4) )No results found for this or any previous visit (from the past 240 hour(s)).   Radiological Exams on Admission: No results found.   Assessment/Plan Principal Problem:   Cellulitis and abscess of right lower extremity Active Problems:   Uncontrolled type 2 diabetes mellitus with hyperglycemia (HCC)   Blindness   Normocytic normochromic anemia   Cellulitis and abscess of right leg    1. Cellulitis and abscess of the right lower extremity -patient is placed on vancomycin and Zosyn.  Follow cultures.  Since patient has difficulty in flexing his right knee completely I have ordered CT of the right knee area to check for any deep abscess or involvement of the joint. 2. Diabetes mellitus type 2 with hyperglycemia -patient states he is type II diabetic but has been  only on insulin since 1997.  Patient takes Lantus insulin 65 units at bedtime which has been ordered now.  Continue with hydration closely follow metabolic panel for any development of DKA. 3. Normocytic normochromic anemia -no old labs to compare.  Will check anemia panel with next blood draw.  Follow CBC. 4. Blindness.   DVT prophylaxis: SCDs until CT results are available. Code Status: Full code. Family Communication: Discussed with patient. Disposition Plan: Home. Consults called: None. Admission status: Inpatient.   Rise Patience MD [facility] Pager [phone].  If 7PM-7AM, please contact night-coverage [email or web address] Password [facility]  12/29/2017, 3:52 AM

## 2017-12-29 NOTE — ED Notes (Signed)
Admitting MD at bedside.

## 2017-12-29 NOTE — ED Provider Notes (Signed)
Medical screening examination/treatment/procedure(s) were conducted as a shared visit with non-physician practitioner(s) and myself.  I personally evaluated the patient during the encounter.   EKG Interpretation None       Patient is a 37 year old male with history of diabetes, blindness who presents to the emergency department with an abscess to the lateral proximal right knee.  Has been on doxycycline.  States that he feels he has had some improvement but is having some pain bending this knee.  His blood sugars have been significantly elevated, in the 500s at home.  He has had subjective fevers.  No vomiting.  On examination, patient has an area of fluctuance and we will perform incision and drainage.  Has surrounding lateral cellulitis but no septic joint.  Given he is blind in his partner is also blind and will have hard time performing wound care and monitoring this wound closely, he has comorbidities such as diabetes and will also limit his healing and his diabetes has been uncontrolled, I have recommended admission and he agrees with this plan.   Treesa Mccully, Layla MawKristen N, DO 12/29/17 [phone]

## 2017-12-29 NOTE — ED Notes (Signed)
Pt. To CT via stretcher. 

## 2017-12-29 NOTE — Progress Notes (Signed)
Received patient from ED accompanied by fiancee who is partially blind.  Patient is totally blind, cellulitis at at RLE near knee, elevated BP at 172/92 d/t pain at 8/10 at RLE and O2Sat at 99% on RA.  Administered PRN pain medication tylenol 650 mg PO per order.  Will monitor.

## 2017-12-29 NOTE — Progress Notes (Addendum)
Pharmacy Antibiotic Note  Cory RossettiBrandon Mullins is a 37 y.o. male admitted on 12/28/2017 with leg pain from right knee. He was seen recently for this and was placed on oral antibitotics with minimal improvement. CT negative for abscess/osteomyelitis.  Pharmacy has been consulted to vancomycin and Zosyn dosing.  Renal function is improving.  Afebrile, WBC down 15.3, LA down 0.73.   Plan: Change vanc to 1gm IV Q8H Zosyn EID 3.375gm IV Q8H Monitor renal fxn, clinical progress, vanc trough as indicated   Temp (24hrs), Avg:98.9 F (37.2 C), Min:98.5 F (36.9 C), Max:99.2 F (37.3 C)  Recent Labs  Lab 12/24/17 1223 12/24/17 1231 12/29/17 0008 12/29/17 0014 12/29/17 0202 12/29/17 0445  WBC 14.6*  --  17.6*  --   --  15.3*  CREATININE 1.20  --  1.19  --   --  0.99  LATICACIDVEN  --  1.44  --  1.05 0.73  --       Vanc 3/22 >> Zosyn 3/2 >>   Cory Mullins Cory Mullins, PharmD, BCPS Pager:  [phone] 12/29/2017, 12:56 PM  ========================================   Addendum: Start Lovenox for VTE prophylaxis BMI = 35, weight 100kg  CBC stable, no bleeding  Lovenox 50mg  SQ Q24H Pharmacy will sign off and follow peripherally.  Thank you for the consult!   Cory Mullins Cory Mullins, PharmD, BCPS Pager:  [phone] 12/29/2017, 2:04 PM

## 2017-12-29 NOTE — Progress Notes (Addendum)
PROGRESS NOTE   Cory Mullins  ZOX:[id number]    DOB: Apr 16, 1981    DOA: 12/28/2017  PCP: Patient, No Pcp Per   I have briefly reviewed patients previous medical records in [facility].  Brief Narrative:  37 year old male with PMH of IDDM diagnosed in the 1990s, blindness, seen in the ED on 12/24/17 for pain and swelling around right knee, discharged on doxycycline, presented again with worsening swelling and pain around right knee and difficulty flexing his knee.  Admitted for cellulitis and abscess lateral to the right knee joint.  Status post I&D by EDP.  Orthopedics consulted.   Assessment & Plan:   Principal Problem:   Cellulitis and abscess of right lower extremity Active Problems:   Uncontrolled type 2 diabetes mellitus with hyperglycemia (HCC)   Blindness   Normocytic normochromic anemia   Cellulitis and abscess of right leg   Cellulitis and abscess of right lower extremity: This is noted lateral to the right knee.  CT right knee appreciated and infection does not seem to have involved the joint.  Status post I&D by EDP in ED.  Despite I&D, noted pus draining from the tiny I&D site.  Thereby orthopedics was consulted, input appreciated and do not recommend any intervention at this time except continuing IV antibiotics (on vancomycin and Zosyn), follow abscess cultures and weightbearing as tolerated.  Uncontrolled DM 2 with hyperglycemia: Now complicated by acute infection.  Continue Lantus 65 units at bedtime.  Change SSI to moderate sensitivity and may consider adding mealtime NovoLog.  Adjust insulins as needed.  No DKA at this time.  Check A1c.  Normocytic anemia: Stable.  Follow CBC.  Blindness   DVT prophylaxis: Lovenox Code Status: Full Family Communication: Discussed with patient's fianc at bedside. Disposition: DC home pending clinical improvement   Consultants:  Orthopedic  Procedures:  I&D by EDP 3/22  Antimicrobials:  IV vancomycin and  Zosyn   Subjective: Right knee area pain and swelling somewhat better after I&D.  No fever or chills.  ROS: As above.  Objective:  Vitals:   12/29/17 0330 12/29/17 0345 12/29/17 0400 12/29/17 0516  BP: 129/74 134/76  (!) 172/92  Pulse: 92 92  94  Resp:      Temp:    98.5 F (36.9 C)  TempSrc:    Oral  SpO2: 93% 95% 96% 99%  Weight:   99.8 kg (220 lb)   Height:   5\' 6"  (1.676 m)     Examination:  General exam: Pleasant young male, moderately built and nourished, sitting up comfortably in bed. Respiratory system: Clear to auscultation. Respiratory effort normal. Cardiovascular system: S1 & S2 heard, RRR. No JVD, murmurs, rubs, gallops or clicks. No pedal edema. Gastrointestinal system: Abdomen is nondistended, soft and nontender. No organomegaly or masses felt. Normal bowel sounds heard. Central nervous system: Alert and oriented. No focal neurological deficits. Extremities: Symmetric 5 x 5 power.  Approximately 2 mm diameter incision posterior lateral aspect of right knee draining purulent fluid.  Surrounding area of induration without fluctuation, warmth.  Mildly tender.  No significant increase in warmth.  No erythema noted.  Able to flex joint reasonably. Skin: No rashes, lesions or ulcers Psychiatry: Judgement and insight appear normal. Mood & affect appropriate.     Data Reviewed: I have personally reviewed following labs and imaging studies  CBC: Recent Labs  Lab 12/24/17 1223 12/29/17 0008 12/29/17 0445  WBC 14.6* 17.6* 15.3*  NEUTROABS 11.1* 13.7*  --   HGB 14.4 11.9*  11.0*  HCT 43.3 35.7* 33.4*  MCV 84.2 84.8 84.1  PLT 275 323 319   Basic Metabolic Panel: Recent Labs  Lab 12/24/17 1223 12/29/17 0008 12/29/17 0445  NA 134* 134* 136  K 3.9 3.8 3.7  CL 100* 102 104  CO2 21* 21* 22  GLUCOSE 385* 414* 296*  BUN 15 17 16   CREATININE 1.20 1.19 0.99  CALCIUM 10.0 9.0 8.8*   CBG: Recent Labs  Lab 12/24/17 1305 12/28/17 1831 12/29/17 0155  12/29/17 0812 12/29/17 1159  GLUCAP 333* 323* 293* 256* 234*    No results found for this or any previous visit (from the past 240 hour(s)).       Radiology Studies: Ct Knee Right Wo Contrast  Result Date: 12/29/2017 CLINICAL DATA:  Knee pain.  Lateral right knee abscess. EXAM: CT OF THE RIGHT KNEE WITHOUT CONTRAST TECHNIQUE: Multidetector CT imaging of the RIGHT knee was performed according to the standard protocol. Multiplanar CT image reconstructions were also generated. COMPARISON:  Radiographs 12/24/2017 FINDINGS: Bones/Joint/Cartilage No fracture. Joint spaces are maintained. No periosteal reaction or bony destructive change. There is a small knee joint effusion. Ligaments Suboptimally assessed by CT. Anterior and posterior cruciate ligament fibers are visualized. Muscles and Tendons No evidence of intramuscular abscess on noncontrast exam. Muscle bulk is maintained. Soft tissues Skin thickening and subcutaneous edema about the lateral knee. Small skin defect and small locules of air in the subcutaneous tissues likely related to recent incision and drainage. No well-defined fluid collection. There are varicosities in the included lower leg. IMPRESSION: 1. Skin thickening and soft tissue edema of the lateral knee without focal fluid collection/abscess. Small skin defect and small air locules in the subcutaneous tissues likely related to recent incision and drainage. 2. Small right knee joint effusion, nonspecific, may be reactive. If there is clinical concern for infected joint fluid, recommend fluid sampling. No CT findings of osteomyelitis. Electronically Signed   By: Rubye OaksMelanie  Ehinger M.D.   On: 12/29/2017 04:36        Scheduled Meds: . insulin aspart  0-9 Units Subcutaneous TID WC  . insulin glargine  65 Units Subcutaneous Daily   Continuous Infusions: . sodium chloride 100 mL/hr at 12/29/17 0521  . piperacillin-tazobactam (ZOSYN)  IV    . vancomycin       LOS: 0 days      Marcellus ScottAnand Renell Coaxum, MD, FACP, [facility]. [facility] Pager [phone]  If 7PM-7AM, please contact night-coverage [email or web address] Password [email or web address] 12/29/2017, 1:27 PM

## 2017-12-29 NOTE — ED Notes (Signed)
CBG [id number] 

## 2017-12-29 NOTE — Progress Notes (Signed)
Pharmacy Antibiotic Note  Cory RossettiBrandon Mullins is a 37 y.o. male admitted on 12/28/2017 with leg pain from R knee abscess. He was seen recently for this and was placed on oral antibitotics with minimal improvement. Planning to admit and treat abscess with IV antibiotics.    Plan: -Vancomycin 1 g IV x1 then 1250 mg IV q12h -Zosyn 3.375 g IV q8h -Monitor renal fx, cultures, VT at steady state    Temp (24hrs), Avg:99.1 F (37.3 C), Min:98.9 F (37.2 C), Max:99.2 F (37.3 C)  Recent Labs  Lab 12/24/17 1223 12/24/17 1231 12/29/17 0008 12/29/17 0014 12/29/17 0202  WBC 14.6*  --  17.6*  --   --   CREATININE 1.20  --  1.19  --   --   LATICACIDVEN  --  1.44  --  1.05 0.73     Antimicrobials this admission: 3/22 vancomycin > 3/22 zosyn >  Dose adjustments this admission: N/A  Microbiology results: N/A  Cory Mullins, Cory Mullins M 12/29/2017 3:57 AM

## 2017-12-30 DIAGNOSIS — H547 Unspecified visual loss: Secondary | ICD-10-CM

## 2017-12-30 LAB — GLUCOSE, CAPILLARY
GLUCOSE-CAPILLARY: 182 mg/dL — AB (ref 65–99)
GLUCOSE-CAPILLARY: 216 mg/dL — AB (ref 65–99)
Glucose-Capillary: 248 mg/dL — ABNORMAL HIGH (ref 65–99)
Glucose-Capillary: 162 mg/dL — ABNORMAL HIGH (ref 65–99)

## 2017-12-30 LAB — CBC
HEMATOCRIT: 33.4 % — AB (ref 39.0–52.0)
HEMOGLOBIN: 11.1 g/dL — AB (ref 13.0–17.0)
MCH: 27.8 pg (ref 26.0–34.0)
MCHC: 33.2 g/dL (ref 30.0–36.0)
MCV: 83.7 fL (ref 78.0–100.0)
Platelets: 317 10*3/uL (ref 150–400)
RBC: 3.99 MIL/uL — ABNORMAL LOW (ref 4.22–5.81)
RDW: 11.7 % (ref 11.5–15.5)
WBC: 16.3 10*3/uL — AB (ref 4.0–10.5)

## 2017-12-30 MED ORDER — INSULIN ASPART 100 UNIT/ML ~~LOC~~ SOLN
3.0000 [IU] | Freq: Three times a day (TID) | SUBCUTANEOUS | Status: DC
  Administered 2017-12-30 – 2018-01-01 (×6): 3 [IU] via SUBCUTANEOUS

## 2017-12-30 NOTE — Progress Notes (Signed)
PROGRESS NOTE   Cory RossettiBrandon Mullins  ZOX:[id number]    DOB: [birth year]    DOA: 12/28/2017  PCP: Patient, No Pcp Per   I have briefly reviewed patients previous medical records in [facility].  Brief Narrative:  37 year old male with PMH of IDDM diagnosed in the 1990s, blindness, seen in the ED on 12/24/17 for pain and swelling around right knee, discharged on doxycycline, presented again with worsening swelling and pain around right knee and difficulty flexing his knee.  Admitted for cellulitis and abscess lateral to the right knee joint.  Status post I&D by EDP.  Orthopedics consulted.  Improving.   Assessment & Plan:   Principal Problem:   Cellulitis and abscess of right lower extremity Active Problems:   Uncontrolled type 2 diabetes mellitus with hyperglycemia (HCC)   Blindness   Normocytic normochromic anemia   Cellulitis and abscess of right leg   Cellulitis and abscess of right lower extremity: This is noted lateral to the right knee.  CT right knee appreciated and infection does not seem to have involved the joint.  Status post I&D by EDP in ED.  Despite I&D, noted pus draining from the tiny I&D site on 3/22.  Thereby orthopedics was consulted, input appreciated and do not recommend any intervention at this time except continuing IV antibiotics (on vancomycin and Zosyn), follow abscess cultures (it appears unfortunately that none were sent from ED) and weightbearing as tolerated.  Improving.  Continue additional 48 hours of IV antibiotics then consider transitioning to oral antibiotics.  Uncontrolled DM 2 with hyperglycemia: Now complicated by acute infection.  Continue Lantus 65 units at bedtime.  Change SSI to moderate sensitivity and may consider adding mealtime NovoLog.  Adjust insulins as needed.  No DKA at this time.  Check A1c: 11.5.  Mildly uncontrolled as below.  Add mealtime NovoLog 3 units.  Normocytic anemia: Stable.  Follow CBC periodically.  Blindness   DVT  prophylaxis: Lovenox Code Status: Full Family Communication: None at bedside. Disposition: DC home pending clinical improvement possibly in the next 2-3 days.   Consultants:  Orthopedic  Procedures:  I&D by EDP 3/22  Antimicrobials:  IV vancomycin and Zosyn   Subjective: Reports that his pain and swelling around right knee are improving and drainage has decreased.  No other complaints reported.  ROS: As above.  Objective:  Vitals:   12/29/17 1410 12/29/17 2109 12/30/17 0525 12/30/17 1300  BP: (!) 145/92 (!) 162/87 (!) 166/88 (!) 154/77  Pulse: 94 95 91 (!) 103  Resp: 17 17 17 18   Temp: 98.4 F (36.9 C) 100.1 F (37.8 C) 99.2 F (37.3 C) 98.2 F (36.8 C)  TempSrc: Oral Oral Oral Oral  SpO2: 100% 100% 100% 100%  Weight:      Height:        Examination:  General exam: Pleasant young male, moderately built and nourished, sitting up comfortably in bed. Respiratory system: Clear to auscultation. Respiratory effort normal. Cardiovascular system: S1 & S2 heard, RRR. No JVD, murmurs, rubs, gallops or clicks. No pedal edema. Gastrointestinal system: Abdomen is nondistended, soft and nontender. No organomegaly or masses felt. Normal bowel sounds heard. Central nervous system: Alert and oriented. No focal neurological deficits. Extremities: Symmetric 5 x 5 power.  Approximately 2 mm diameter incision posterior lateral aspect of right knee not actively draining pus today.  Covering dressing minimally soiled.  Surrounding area of induration without fluctuation, warmth.  Mildly tender, less today.  No significant increase in warmth.  No erythema  noted.  Able to flex joint reasonably. Skin: No rashes, lesions or ulcers Psychiatry: Judgement and insight appear normal. Mood & affect appropriate.     Data Reviewed: I have personally reviewed following labs and imaging studies  CBC: Recent Labs  Lab 12/24/17 1223 12/29/17 0008 12/29/17 0445 12/30/17 0612  WBC 14.6* 17.6*  15.3* 16.3*  NEUTROABS 11.1* 13.7*  --   --   HGB 14.4 11.9* 11.0* 11.1*  HCT 43.3 35.7* 33.4* 33.4*  MCV 84.2 84.8 84.1 83.7  PLT 275 323 319 317   Basic Metabolic Panel: Recent Labs  Lab 12/24/17 1223 12/29/17 0008 12/29/17 0445  NA 134* 134* 136  K 3.9 3.8 3.7  CL 100* 102 104  CO2 21* 21* 22  GLUCOSE 385* 414* 296*  BUN 15 17 16   CREATININE 1.20 1.19 0.99  CALCIUM 10.0 9.0 8.8*   CBG: Recent Labs  Lab 12/29/17 1650 12/29/17 2216 12/30/17 0755 12/30/17 1219 12/30/17 1708  GLUCAP 207* 226* 182* 216* 248*    No results found for this or any previous visit (from the past 240 hour(s)).       Radiology Studies: Ct Knee Right Wo Contrast  Result Date: 12/29/2017 CLINICAL DATA:  Knee pain.  Lateral right knee abscess. EXAM: CT OF THE RIGHT KNEE WITHOUT CONTRAST TECHNIQUE: Multidetector CT imaging of the RIGHT knee was performed according to the standard protocol. Multiplanar CT image reconstructions were also generated. COMPARISON:  Radiographs 12/24/2017 FINDINGS: Bones/Joint/Cartilage No fracture. Joint spaces are maintained. No periosteal reaction or bony destructive change. There is a small knee joint effusion. Ligaments Suboptimally assessed by CT. Anterior and posterior cruciate ligament fibers are visualized. Muscles and Tendons No evidence of intramuscular abscess on noncontrast exam. Muscle bulk is maintained. Soft tissues Skin thickening and subcutaneous edema about the lateral knee. Small skin defect and small locules of air in the subcutaneous tissues likely related to recent incision and drainage. No well-defined fluid collection. There are varicosities in the included lower leg. IMPRESSION: 1. Skin thickening and soft tissue edema of the lateral knee without focal fluid collection/abscess. Small skin defect and small air locules in the subcutaneous tissues likely related to recent incision and drainage. 2. Small right knee joint effusion, nonspecific, may be  reactive. If there is clinical concern for infected joint fluid, recommend fluid sampling. No CT findings of osteomyelitis. Electronically Signed   By: Rubye Oaks M.D.   On: 12/29/2017 04:36        Scheduled Meds: . enoxaparin (LOVENOX) injection  50 mg Subcutaneous Daily  . insulin aspart  0-15 Units Subcutaneous TID WC  . insulin aspart  0-5 Units Subcutaneous QHS  . insulin glargine  65 Units Subcutaneous Daily   Continuous Infusions: . piperacillin-tazobactam (ZOSYN)  IV Stopped (12/30/17 1649)  . vancomycin Stopped (12/30/17 1533)     LOS: 1 day     Marcellus Scott, MD, FACP, [facility]. [facility] Pager [phone]  If 7PM-7AM, please contact night-coverage [email or web address] Password [facility] 12/30/2017, 5:40 PM

## 2017-12-31 DIAGNOSIS — L02415 Cutaneous abscess of right lower limb: Principal | ICD-10-CM

## 2017-12-31 DIAGNOSIS — L03115 Cellulitis of right lower limb: Secondary | ICD-10-CM

## 2017-12-31 LAB — VANCOMYCIN, TROUGH: Vancomycin Tr: 12 ug/mL — ABNORMAL LOW (ref 15–20)

## 2017-12-31 LAB — GLUCOSE, CAPILLARY
GLUCOSE-CAPILLARY: 186 mg/dL — AB (ref 65–99)
GLUCOSE-CAPILLARY: 195 mg/dL — AB (ref 65–99)
Glucose-Capillary: 172 mg/dL — ABNORMAL HIGH (ref 65–99)
Glucose-Capillary: 160 mg/dL — ABNORMAL HIGH (ref 65–99)

## 2017-12-31 NOTE — Progress Notes (Signed)
PROGRESS NOTE    Donnamarie RossettiBrandon Sparlin  ZOX:[id number] DOB: 1981/04/05 DOA: 12/28/2017 PCP: Patient, No Pcp Per      Brief Narrative:  37 year old male with PMH of IDDM diagnosed in the 1990s, blindness, seen in the ED on 12/24/17 for pain and swelling around right knee, discharged on doxycycline, presented again with worsening swelling and pain around right knee and difficulty flexing his knee.  Admitted for cellulitis and abscess lateral to the right knee joint.  Status post I&D by EDP.  Orthopedics consulted.  Improving.      Assessment & Plan:  Cellulitis and abscess of right lower extremity:  I&D in ER.  Failed outpatietn doxycycline. -Continue Vancomycin/Zosyn -Likely discharge tomorrow with Bactrim, cefazolin  Uncontrolled DM 2 with hyperglycemia:  -Continue home Lantus -Continue SSI and mealtime  Normocytic anemia:  Stable.  From chronic disease  Blindness     DVT prophylaxis: Lovenox Code Status: FULL Family Communication: Partenr and sisters at bedside MDM and disposition Plan: The below labs and imaging reports were reviewed.  The patient's status is clinically improving. Pain improved, able to tolerate PO.  WIll given 1 more day IV antibiotics, then likely home with oral antibiotics to finish course.       Subjective: Feels better. No new fever.  Able to ambulate a few feet despite pain.  Objective: Vitals:   12/30/17 1300 12/30/17 2057 12/31/17 0552 12/31/17 1435  BP: (!) 154/77 (!) 158/90 (!) 159/90 (!) 142/82  Pulse: (!) 103 94 94 97  Resp: 18 18 18    Temp: 98.2 F (36.8 C) 98.4 F (36.9 C) 98.8 F (37.1 C) 98.6 F (37 C)  TempSrc: Oral Oral Oral Oral  SpO2: 100% 100% 100% 99%  Weight:      Height:        Intake/Output Summary (Last 24 hours) at 12/31/2017 1715 Last data filed at 12/31/2017 1213 Gross per 24 hour  Intake 660 ml  Output 3625 ml  Net -2965 ml   Filed Weights   12/29/17 0400  Weight: 99.8 kg (220 lb)     Examination: General appearance: Obese adult male, alert and in no acute distress.   HEENT: [place].   Lips moist.   Skin: Warm and dry.  No suspicious rashes or lesions. Cardiac: RRR, nl S1-S2, no murmurs appreciated.    Respiratory: Normal respiratory rate and rhythm.  CTAB without rales or wheezes. Abdomen: Abdomen soft.  No TTP. No ascites, distension, hepatosplenomegaly.   MSK: No deformities or effusions.  The right knee appears red and swollen laterally, no joint pain, no effusion.  The redness appears improved. Neuro: Awake and alert.  EOMI, moves all extremities. Speech fluent.    Psych: Sensorium intact and responding to questions, attention normal. Affect normal.  Judgment and insight appear normal.    Data Reviewed: I have personally reviewed following labs and imaging studies:  CBC: Recent Labs  Lab 12/29/17 0008 12/29/17 0445 12/30/17 0612  WBC 17.6* 15.3* 16.3*  NEUTROABS 13.7*  --   --   HGB 11.9* 11.0* 11.1*  HCT 35.7* 33.4* 33.4*  MCV 84.8 84.1 83.7  PLT 323 319 317   Basic Metabolic Panel: Recent Labs  Lab 12/29/17 0008 12/29/17 0445  NA 134* 136  K 3.8 3.7  CL 102 104  CO2 21* 22  GLUCOSE 414* 296*  BUN 17 16  CREATININE 1.19 0.99  CALCIUM 9.0 8.8*   GFR: Estimated Creatinine Clearance: 115.2 mL/min (by C-G formula based on SCr of 0.99 mg/dL).  Liver Function Tests: No results for input(s): AST, ALT, ALKPHOS, BILITOT, PROT, ALBUMIN in the last 168 hours. No results for input(s): LIPASE, AMYLASE in the last 168 hours. No results for input(s): AMMONIA in the last 168 hours. Coagulation Profile: No results for input(s): INR, PROTIME in the last 168 hours. Cardiac Enzymes: No results for input(s): CKTOTAL, CKMB, CKMBINDEX, TROPONINI in the last 168 hours. BNP (last 3 results) No results for input(s): PROBNP in the last 8760 hours. HbA1C: Recent Labs    12/29/17 0445  HGBA1C 11.5*   CBG: Recent Labs  Lab 12/30/17 1219 12/30/17 1708  12/30/17 2052 12/31/17 0814 12/31/17 1217  GLUCAP 216* 248* 162* 172* 160*   Lipid Profile: No results for input(s): CHOL, HDL, LDLCALC, TRIG, CHOLHDL, LDLDIRECT in the last 72 hours. Thyroid Function Tests: No results for input(s): TSH, T4TOTAL, FREET4, T3FREE, THYROIDAB in the last 72 hours. Anemia Panel: No results for input(s): VITAMINB12, FOLATE, FERRITIN, TIBC, IRON, RETICCTPCT in the last 72 hours. Urine analysis: No results found for: COLORURINE, APPEARANCEUR, LABSPEC, PHURINE, GLUCOSEU, HGBUR, BILIRUBINUR, KETONESUR, PROTEINUR, UROBILINOGEN, NITRITE, LEUKOCYTESUR Sepsis Labs: @LABRCNTIP (procalcitonin:4,lacticacidven:4)  )No results found for this or any previous visit (from the past 240 hour(s)).       Radiology Studies: No results found.      Scheduled Meds: . enoxaparin (LOVENOX) injection  50 mg Subcutaneous Daily  . insulin aspart  0-15 Units Subcutaneous TID WC  . insulin aspart  0-5 Units Subcutaneous QHS  . insulin aspart  3 Units Subcutaneous TID WC  . insulin glargine  65 Units Subcutaneous Daily   Continuous Infusions: . piperacillin-tazobactam (ZOSYN)  IV 3.375 g (12/31/17 1318)  . vancomycin Stopped (12/31/17 1434)     LOS: 2 days    Time spent: 25 minutes    Alberteen Sam, MD [facility] 12/31/2017, 11:15 AM     Pager [phone] --- please page though AMION:  [email or web address] Password [email or web address] If 7PM-7AM, please contact night-coverage

## 2017-12-31 NOTE — Progress Notes (Addendum)
0600 Vanco trough was drawn, still awaiting for lab results.  [id number]  Per pharmacy, ok to hang Vancomycin.

## 2017-12-31 NOTE — Progress Notes (Signed)
Pharmacy Antibiotic Note  Cory Mullins is a 37 y.o. male admitted on 12/28/2017 with leg pain from right knee. He was seen recently for this and was placed on oral antibitotics with minimal improvement. CT negative for abscess/osteomyelitis, though he is s/p I&D in the ED with minimal drainage on 12/29/17.  Pharmacy has been consulted for vancomycin and Zosyn dosing.  Renal function is improving.  Vancomycin trough acceptable at 12 mcg/mL and expect more accumulation.  Afebrile, WBC down 15.3, LA down 0.73.   Plan: Continue vanc 1gm IV Q8H Zosyn EID 3.375gm IV Q8H Monitor renal fxn, clinical progress, PRN vanc trough BMET in AM F/U with transitioning to oral abx soon   Temp (24hrs), Avg:98.5 F (36.9 C), Min:98.2 F (36.8 C), Max:98.8 F (37.1 C)  Recent Labs  Lab 12/24/17 1223 12/24/17 1231 12/29/17 0008 12/29/17 0014 12/29/17 0202 12/29/17 0445 12/30/17 0612 12/31/17 0533  WBC 14.6*  --  17.6*  --   --  15.3* 16.3*  --   CREATININE 1.20  --  1.19  --   --  0.99  --   --   LATICACIDVEN  --  1.44  --  1.05 0.73  --   --   --   VANCOTROUGH  --   --   --   --   --   --   --  12*     Vanc 3/22 >> Zosyn 3/2 >>  3/24 VT = 12 mcg/mL on 1g q8 >> no change   Donya Hitch D. Laney Potashang, PharmD, BCPS Pager:  [phone] 12/31/2017, 7:34 AM

## 2018-01-01 LAB — GLUCOSE, CAPILLARY
GLUCOSE-CAPILLARY: 212 mg/dL — AB (ref 65–99)
GLUCOSE-CAPILLARY: 207 mg/dL — AB (ref 65–99)
Glucose-Capillary: 220 mg/dL — ABNORMAL HIGH (ref 65–99)

## 2018-01-01 MED ORDER — SULFAMETHOXAZOLE-TRIMETHOPRIM 800-160 MG PO TABS: 2.0000 | ORAL_TABLET | Freq: Two times a day (BID) | ORAL | 0 refills | Status: DC

## 2018-01-01 MED ORDER — AMOXICILLIN-POT CLAVULANATE 875-125 MG PO TABS
1.0000 | ORAL_TABLET | Freq: Two times a day (BID) | ORAL | Status: DC
  Administered 2018-01-01: 1 via ORAL
  Filled 2018-01-01: qty 1

## 2018-01-01 MED ORDER — AMOXICILLIN-POT CLAVULANATE 875-125 MG PO TABS: 1.0000 | ORAL_TABLET | Freq: Two times a day (BID) | ORAL | 0 refills | Status: DC

## 2018-01-01 MED ORDER — SULFAMETHOXAZOLE-TRIMETHOPRIM 800-160 MG PO TABS
2.0000 | ORAL_TABLET | Freq: Two times a day (BID) | ORAL | Status: DC
  Administered 2018-01-01: 2 via ORAL
  Filled 2018-01-01: qty 2

## 2018-01-01 NOTE — Care Management Note (Addendum)
Case Management Note  Patient Details  Name: Cory Mullins MRN: [id number] Date of Birth: 30-Oct-1980  Subjective/Objective:                  Admitted for cellulitis and abscess lateral to the right knee joint.   Action/Plan: Status post I&D by EDP;  In to speak with patient, discussed no PCP noted.  Appointment scheduled to establish PCP.  Also discussed DME equipment needs; patient requested rolling walker/bedside commode.  Patient offered choice of provider, selected [facility].  [facility], arranged for delivery prior to discharge.  Discussed recommendation for Home Health Physical therapy, offered choice. Patient selected [facility], [facility]   Expected Discharge Date:  01/01/18               Expected Discharge Plan:  Home/Self Care  Discharge planning Services  CM Consult, Follow-up appt scheduled  Post Acute Care Choice:  Durable Medical Equipment Choice offered to:  Patient  DME Arranged:  3-N-1, Walker rolling DME Agency:  [facility]  HH Arranged:   PT HH Agency:   [facility]  Status of Service:  Completed, signed off  Yancey FlemingsKimberly R Becton, RN  Nurse case manager [place] 01/01/2018, 12:49 PM

## 2018-01-01 NOTE — Progress Notes (Signed)
Spoke with patient regarding No PCP listed.  Patient states he has already spoke with a Provider Office about establishing PCP they did not have an appointment until April.  NCM encouraged patient to set-up appointment to establish care.  NCM advised patient if therapy is ordered at discharge its required that he has a PCP.  Patient verbalized understanding.  Yancey FlemingsKimberly R Becton, BSN, RN Nurse Case Sales executiveManager [place] [phone]

## 2018-01-01 NOTE — Evaluation (Signed)
Physical Therapy Evaluation Patient Details Name: Cory Mullins MRN: [id number] DOB: 09-Aug-1981 Today's Date: 01/01/2018   History of Present Illness  37 yo admitted with Rt knee abscess s/p I&D. PMHx: blind, IDDM  Clinical Impression  Pt very pleasant and very motivated to move and return to home and work. Pt has a fiancee with 2nd floor apt who can assist but pt unable to ascend stairs at this time and educated for bumping up them in sitting as well as use of RW and BSC for home which pt was agreeable to all. Pt with limited endurance for gait and unable to ambulate more than 40' who will benefit from HHPT to maximize independence and function. Pt with decreased strength, ROM, gait and functional mobility who will benefit from acute therapy to maximize mobility and independence to decrease burden of care.      Follow Up Recommendations Home health PT;Supervision for mobility/OOB    Equipment Recommendations  Rolling walker with 5" wheels;3in1 (PT)    Recommendations for Other Services OT consult     Precautions / Restrictions Precautions Precautions: Fall Restrictions Weight Bearing Restrictions: Yes RLE Weight Bearing: Weight bearing as tolerated      Mobility  Bed Mobility               General bed mobility comments: in chair on arrival  Transfers Overall transfer level: Needs assistance   Transfers: Sit to/from Stand Sit to Stand: Min guard         General transfer comment: cues for hand placement and positioning  Ambulation/Gait Ambulation/Gait assistance: Min guard Ambulation Distance (Feet): 40 Feet Assistive device: Rolling walker (2 wheeled) Gait Pattern/deviations: Step-to pattern;Antalgic;Trunk flexed   Gait velocity interpretation: Below normal speed for age/gender General Gait Details: cues for posture, position in RW, direction and sequence. Pt with very limited tolerance for weight bearing on RLE with step to pattern and limited  endurance  Stairs            Wheelchair Mobility    Modified Rankin (Stroke Patients Only)       Balance Overall balance assessment: Needs assistance   Sitting balance-Leahy Scale: Good       Standing balance-Leahy Scale: Poor                               Pertinent Vitals/Pain Pain Assessment: 0-10 Pain Score: 5  Pain Location: Rt knee Pain Descriptors / Indicators: Aching;Sore Pain Intervention(s): Limited activity within patient's tolerance;Repositioned    Home Living Family/patient expects to be discharged to:: Private residence Living Arrangements: Alone Available Help at Discharge: Friend(s);Available 24 hours/day Type of Home: Apartment Home Access: Level entry     Home Layout: One level Home Equipment: Other (comment)(sight cane) Additional Comments: pt lives in single story home with level entry, no furniture sleeping on air mattress. He plans to stay with fiancee who is on 2nd floor apt without elevator    Prior Function Level of Independence: Independent               Hand Dominance   Dominant Hand: Right    Extremity/Trunk Assessment   Upper Extremity Assessment Upper Extremity Assessment: Overall WFL for tasks assessed    Lower Extremity Assessment Lower Extremity Assessment: RLE deficits/detail RLE Deficits / Details: decreased ROM and strength due to pain    Cervical / Trunk Assessment Cervical / Trunk Assessment: Other exceptions Cervical / Trunk Exceptions: rounded shoulders  Communication  Communication: Other (comment)(blind)  Cognition Arousal/Alertness: Awake/alert Behavior During Therapy: WFL for tasks assessed/performed Overall Cognitive Status: Within Functional Limits for tasks assessed                                        General Comments      Exercises     Assessment/Plan    PT Assessment Patient needs continued PT services  PT Problem List Decreased strength;Decreased  mobility;Decreased range of motion;Decreased activity tolerance;Decreased knowledge of use of DME;Pain       PT Treatment Interventions Gait training;Therapeutic exercise;Patient/family education;Stair training;Functional mobility training;DME instruction;Therapeutic activities    PT Goals (Current goals can be found in the Care Plan section)  Acute Rehab PT Goals Patient Stated Goal: return to work at [employer] PT Goal Formulation: With patient Time For Goal Achievement: 01/15/18 Potential to Achieve Goals: Good    Frequency Min 3X/week   Barriers to discharge        Co-evaluation               AM-PAC PT "6 Clicks" Daily Activity  Outcome Measure Difficulty turning over in bed (including adjusting bedclothes, sheets and blankets)?: A Little Difficulty moving from lying on back to sitting on the side of the bed? : A Little Difficulty sitting down on and standing up from a chair with arms (e.g., wheelchair, bedside commode, etc,.)?: A Little Help needed moving to and from a bed to chair (including a wheelchair)?: A Little Help needed walking in hospital room?: A Little Help needed climbing 3-5 steps with a railing? : A Lot 6 Click Score: 17    End of Session Equipment Utilized During Treatment: Gait belt Activity Tolerance: Patient tolerated treatment well Patient left: in chair;with call bell/phone within reach Nurse Communication: Mobility status;Weight bearing status;Precautions PT Visit Diagnosis: Other abnormalities of gait and mobility (R26.89);Muscle weakness (generalized) (M62.81);Pain Pain - Right/Left: Right Pain - part of body: Knee    Time: 1610-9604 PT Time Calculation (min) (ACUTE ONLY): 26 min   Charges:   PT Evaluation $PT Eval Moderate Complexity: 1 Mod PT Treatments $Gait Training: 8-22 mins   PT G Codes:        Delaney Meigs, PT [phone]   Dakotah Heiman B Hanley Rispoli 01/01/2018, 12:29 PM

## 2018-01-01 NOTE — Discharge Summary (Signed)
Physician Discharge Summary  Olvin Rohr ZDG:[id number] DOB: 08-06-1981 DOA: 12/28/2017  PCP: Patient, No Pcp Per  Admit date: 12/28/2017 Discharge date: 01/01/2018  Admitted From: Home  Disposition:  Home   Recommendations for Outpatient Follow-up:  1. Follow up with PCP in 1 weeks   Home Health: None  Equipment/Devices: Rolling walker with 5" wheels, 3-in-1  Discharge Condition: Good  CODE STATUS: FULL Diet recommendation: Diabetic  Brief/Interim Summary: Mr. Chaffin is a 37 yo M with IDDM and blindness who presents with right leg pain and swelling, failed outpatient therapy.  Initially developed some redness and swelling of the right knee, seen in ER, prescribed doxycycline.  Took this, but swelling worsened, he developed difficulty flexing the knee, returned and there was now fluctuance.  There was an abscess that was drained, cultures not sent.  He had cellulitis surrounding and so was admitted for IV antibiotics.    Cellulitis and abscess of the right knee No concern for septic joint.  CT leg after I&D showed no further fluid collection.  Treated with 4d Vancomycin/Zosyn, knee improved.  Discharged to complete 10 days with Bactrim and Augmentin.  I&D wound site appeared shallow and healing well without drainage on day of discharge.  Diabetes Well controlled on home regimen.       Discharge Diagnoses:  Principal Problem:   Cellulitis and abscess of right lower extremity Active Problems:   Uncontrolled type 2 diabetes mellitus with hyperglycemia (HCC)   Blindness   Normocytic normochromic anemia   Cellulitis and abscess of right leg    Discharge Instructions  Discharge Instructions    Diet general   Complete by:  As directed    Discharge instructions   Complete by:  As directed    From Dr. Loleta Books:  For your infection:    Cover the wound each day with a new clean bandage (either tape over gauze or a bandaid).  You may put antibiotic ointment, like  Neosporin, on it each time you change the dressing.     Throw the doxycycline away    Take Bactrim DS 2 tabs twice daily    Take Augmentin 1 tab twice daily  Follow up with your primary doctor in 1 week. If you develop a rash, tender skin or tenderness in your mouth, stop the antibiotics and call your doctor immediately.  If you develop fever, chills, nausea, or worsening infection in your right leg, call your doctor to be seen.   Increase activity slowly   Complete by:  As directed      Allergies as of 01/01/2018   No Known Allergies     Medication List    STOP taking these medications   doxycycline 100 MG capsule Commonly known as:  VIBRAMYCIN     TAKE these medications   amoxicillin-clavulanate 875-125 MG tablet Commonly known as:  AUGMENTIN Take 1 tablet by mouth every 12 (twelve) hours.   insulin glargine 100 UNIT/ML injection Commonly known as:  LANTUS Inject 0.65 mLs (65 Units total) into the skin at bedtime.   Prodigy Autocode Blood Glucose w/Device Kit 1 Device by Does not apply route daily.   PRODIGY LANCETS 28G Misc Use lancets to check blood glucose daily   sulfamethoxazole-trimethoprim 800-160 MG tablet Commonly known as:  BACTRIM DS,SEPTRA DS Take 2 tablets by mouth every 12 (twelve) hours.            Durable Medical Equipment  (From admission, onward)        Start  Ordered   01/01/18 1327  For home use only DME Walker rolling  [facility])  Once    Question:  Patient needs a walker to treat with the following condition  Answer:  Cellulitis   01/01/18 1329   01/01/18 1327  DME 3-in-1  Once     01/01/18 1329   01/01/18 1252  For home use only DME 3 n 1  Once     01/01/18 1251   01/01/18 1251  For home use only DME Walker rolling  Once    Question:  Patient needs a walker to treat with the following condition  Answer:  Generalized weakness   01/01/18 1251     Follow-up Information    [facility], [facility] Go on  01/10/2018.   Specialty:  Family Medicine Why:  2:15 PM       [facility] Follow up.   Why:  Arranged for rolling walker, 3-in-1 to be delivered prior to discharge. Contact information: [address] [place] [address] [phone]        Home, [facility] Follow up.   Specialty:  Home Health Services Why:  Will call you to set up visit Contact information: [address] [place] [address] [phone]          No Known Allergies  Consultations:  None   Procedures/Studies: Ct Knee Right Wo Contrast  Result Date: 12/29/2017 CLINICAL DATA:  Knee pain.  Lateral right knee abscess. EXAM: CT OF THE RIGHT KNEE WITHOUT CONTRAST TECHNIQUE: Multidetector CT imaging of the RIGHT knee was performed according to the standard protocol. Multiplanar CT image reconstructions were also generated. COMPARISON:  Radiographs 12/24/2017 FINDINGS: Bones/Joint/Cartilage No fracture. Joint spaces are maintained. No periosteal reaction or bony destructive change. There is a small knee joint effusion. Ligaments Suboptimally assessed by CT. Anterior and posterior cruciate ligament fibers are visualized. Muscles and Tendons No evidence of intramuscular abscess on noncontrast exam. Muscle bulk is maintained. Soft tissues Skin thickening and subcutaneous edema about the lateral knee. Small skin defect and small locules of air in the subcutaneous tissues likely related to recent incision and drainage. No well-defined fluid collection. There are varicosities in the included lower leg. IMPRESSION: 1. Skin thickening and soft tissue edema of the lateral knee without focal fluid collection/abscess. Small skin defect and small air locules in the subcutaneous tissues likely related to recent incision and drainage. 2. Small right knee joint effusion, nonspecific, may be reactive. If there is clinical concern for infected joint fluid, recommend fluid sampling. No CT findings of  osteomyelitis. Electronically Signed   By: Jeb Levering M.D.   On: 12/29/2017 04:36   Dg Knee Complete 4 Views Right  Result Date: 12/24/2017 CLINICAL DATA:  Knee pain for 1-2 weeks. EXAM: RIGHT KNEE - COMPLETE 4+ VIEW COMPARISON:  None. FINDINGS: No fracture, dislocation, or joint effusion.  No bony erosion. IMPRESSION: Negative. Electronically Signed   By: Dorise Bullion III M.D   On: 12/24/2017 12:00      Subjective: Feels well.  No new fever, nausea, malaise.  Pain and swelling in right leg are improving.  Stil a lot of pain with standing.  No increase in drainage, no new redness.  Discharge Exam: Vitals:   12/31/17 2054 01/01/18 0513  BP: (!) 147/77 (!) 148/85  Pulse: 99 95  Resp: 18 18  Temp: 98.4 F (36.9 C) 97.6 F (36.4 C)  SpO2: 99% 99%   Vitals:  12/31/17 0552 12/31/17 1435 12/31/17 2054 01/01/18 0513  BP: (!) 159/90 (!) 142/82 (!) 147/77 (!) 148/85  Pulse: 94 97 99 95  Resp: 18  18 18   Temp: 98.8 F (37.1 C) 98.6 F (37 C) 98.4 F (36.9 C) 97.6 F (36.4 C)  TempSrc: Oral Oral Oral Oral  SpO2: 100% 99% 99% 99%  Weight:      Height:        General: Pt is alert, awake, not in acute distress HEENT: Amblopia, blind.  Cardiovascular: RRR, S1/S2 +, no rubs, no gallops Respiratory: CTA bilaterally, no wheezing, no rhonchi Abdominal: Soft, NT, ND, bowel sounds + Extremities: no edema, no cyanosis Skin: Scant old dry serous drainage.  No redness of swelling around wound.   The results of significant diagnostics from this hospitalization (including imaging, microbiology, ancillary and laboratory) are listed below for reference.     Microbiology: No results found for this or any previous visit (from the past 240 hour(s)).   Labs: BNP (last 3 results) No results for input(s): BNP in the last 8760 hours. Basic Metabolic Panel: Recent Labs  Lab 12/29/17 0008 12/29/17 0445  NA 134* 136  K 3.8 3.7  CL 102 104  CO2 21* 22  GLUCOSE 414* 296*  BUN 17  16  CREATININE 1.19 0.99  CALCIUM 9.0 8.8*   Liver Function Tests: No results for input(s): AST, ALT, ALKPHOS, BILITOT, PROT, ALBUMIN in the last 168 hours. No results for input(s): LIPASE, AMYLASE in the last 168 hours. No results for input(s): AMMONIA in the last 168 hours. CBC: Recent Labs  Lab 12/29/17 0008 12/29/17 0445 12/30/17 0612  WBC 17.6* 15.3* 16.3*  NEUTROABS 13.7*  --   --   HGB 11.9* 11.0* 11.1*  HCT 35.7* 33.4* 33.4*  MCV 84.8 84.1 83.7  PLT 323 319 317   Cardiac Enzymes: No results for input(s): CKTOTAL, CKMB, CKMBINDEX, TROPONINI in the last 168 hours. BNP: Invalid input(s): POCBNP CBG: Recent Labs  Lab 12/31/17 1217 12/31/17 1719 12/31/17 2048 01/01/18 0808 01/01/18 1200  GLUCAP 160* 186* 195* 212* 207*   D-Dimer No results for input(s): DDIMER in the last 72 hours. Hgb A1c No results for input(s): HGBA1C in the last 72 hours. Lipid Profile No results for input(s): CHOL, HDL, LDLCALC, TRIG, CHOLHDL, LDLDIRECT in the last 72 hours. Thyroid function studies No results for input(s): TSH, T4TOTAL, T3FREE, THYROIDAB in the last 72 hours.  Invalid input(s): FREET3 Anemia work up No results for input(s): VITAMINB12, FOLATE, FERRITIN, TIBC, IRON, RETICCTPCT in the last 72 hours. Urinalysis No results found for: COLORURINE, APPEARANCEUR, LABSPEC, [place], GLUCOSEU, HGBUR, BILIRUBINUR, KETONESUR, PROTEINUR, UROBILINOGEN, NITRITE, LEUKOCYTESUR Sepsis Labs Invalid input(s): PROCALCITONIN,  WBC,  LACTICIDVEN Microbiology No results found for this or any previous visit (from the past 240 hour(s)).   Time coordinating discharge: Over 30 minutes  SIGNED:   Edwin Dada, MD  [facility] 01/01/2018, 1:31 PM

## 2018-07-17 ENCOUNTER — Other Ambulatory Visit: Payer: Self-pay

## 2018-07-17 ENCOUNTER — Emergency Department (HOSPITAL_COMMUNITY): Payer: BLUE CROSS/BLUE SHIELD

## 2018-07-17 ENCOUNTER — Emergency Department (HOSPITAL_COMMUNITY)
Admission: EM | Admit: 2018-07-17 | Discharge: 2018-07-17 | Disposition: A | Discharge status: Home / Self Care | Payer: BLUE CROSS/BLUE SHIELD | Attending: Emergency Medicine | Admitting: Emergency Medicine

## 2018-07-17 ENCOUNTER — Encounter (HOSPITAL_COMMUNITY): Payer: Self-pay

## 2018-07-17 DIAGNOSIS — Z794 Long term (current) use of insulin: Secondary | ICD-10-CM | POA: Insufficient documentation

## 2018-07-17 DIAGNOSIS — R531 Weakness: Secondary | ICD-10-CM

## 2018-07-17 DIAGNOSIS — G629 Polyneuropathy, unspecified: Secondary | ICD-10-CM | POA: Diagnosis not present

## 2018-07-17 DIAGNOSIS — Z79899 Other long term (current) drug therapy: Secondary | ICD-10-CM | POA: Insufficient documentation

## 2018-07-17 DIAGNOSIS — I10 Essential (primary) hypertension: Secondary | ICD-10-CM | POA: Diagnosis not present

## 2018-07-17 DIAGNOSIS — E119 Type 2 diabetes mellitus without complications: Secondary | ICD-10-CM | POA: Diagnosis not present

## 2018-07-17 HISTORY — DX: Essential (primary) hypertension: I10

## 2018-07-17 LAB — BASIC METABOLIC PANEL
ANION GAP: 10 (ref 5–15)
BUN: 12 mg/dL (ref 6–20)
CALCIUM: 9.4 mg/dL (ref 8.9–10.3)
CHLORIDE: 104 mmol/L (ref 98–111)
CO2: 24 mmol/L (ref 22–32)
Creatinine, Ser: 1.16 mg/dL (ref 0.61–1.24)
GFR calc non Af Amer: >60 mL/min (ref >60)
Glucose, Bld: 108 mg/dL — ABNORMAL HIGH (ref 70–99)
POTASSIUM: 3.2 mmol/L — AB (ref 3.5–5.1)
Sodium: 138 mmol/L (ref 135–145)

## 2018-07-17 LAB — CBC WITH DIFFERENTIAL/PLATELET
ABS IMMATURE GRANULOCYTES: 0.08 10*3/uL — AB (ref 0.00–0.07)
Basophils Absolute: 0.1 10*3/uL (ref 0.0–0.1)
Basophils Relative: 0 %
EOS ABS: 2.1 10*3/uL — AB (ref 0.0–0.5)
Eosinophils Relative: 13 %
HEMATOCRIT: 33.6 % — AB (ref 39.0–52.0)
Hemoglobin: 10.5 g/dL — ABNORMAL LOW (ref 13.0–17.0)
IMMATURE GRANULOCYTES: 1 %
LYMPHS ABS: 1.7 10*3/uL (ref 0.7–4.0)
Lymphocytes Relative: 11 %
MCH: 28.5 pg (ref 26.0–34.0)
MCHC: 31.3 g/dL (ref 30.0–36.0)
MCV: 91.1 fL (ref 80.0–100.0)
Monocytes Absolute: 1 10*3/uL (ref 0.1–1.0)
Monocytes Relative: 6 %
NEUTROS ABS: 11.3 10*3/uL — AB (ref 1.7–7.7)
NEUTROS PCT: 69 %
NRBC: 0 % (ref 0.0–0.2)
Platelets: 347 10*3/uL (ref 150–400)
RBC: 3.69 MIL/uL — ABNORMAL LOW (ref 4.22–5.81)
RDW: 13.3 % (ref 11.5–15.5)
WBC: 16.3 10*3/uL — ABNORMAL HIGH (ref 4.0–10.5)

## 2018-07-17 LAB — TYPE AND SCREEN
ABO/RH(D): B NEG
ANTIBODY SCREEN: NEGATIVE

## 2018-07-17 LAB — ABO/RH: ABO/RH(D): B NEG

## 2018-07-17 LAB — TROPONIN I: Troponin I: <0.03 ng/mL (ref <0.03)

## 2018-07-17 MED ORDER — OXYCODONE-ACETAMINOPHEN 5-325 MG PO TABS
1.0000 | ORAL_TABLET | Freq: Once | ORAL | Status: AC
  Administered 2018-07-17: 1 via ORAL
  Filled 2018-07-17: qty 1

## 2018-07-17 NOTE — ED Provider Notes (Signed)
[facility] EMERGENCY DEPARTMENT Provider Note   CSN: [id number] Arrival date & time: 07/17/18  0900     History   Chief Complaint Chief Complaint  Patient presents with  . Weakness  . Shortness of Breath    HPI Cory Mullins is a 37 y.o. male.  HPI Patient is a 37 year old male presents the emergency department with mild generalized weakness.  He was seen by his primary care physician yesterday in regards to his recurrent lower leg pain and was started on Neurontin for neuropathy.  He has not filled his Neurontin yet.  He reported to EMS that he was having some exertional shortness of breath.  He denies unilateral leg swelling.  No history of DVT or pulmonary embolism.  No recent cough or congestion.  Denies shortness of breath at this time.   Past Medical History:  Diagnosis Date  . Blind   . Diabetes mellitus without complication ([place])   . Hypertension     Patient Active Problem List   Diagnosis Date Noted  . Cellulitis and abscess of right lower extremity 12/29/2017  . Uncontrolled type 2 diabetes mellitus with hyperglycemia ([place]) 12/29/2017  . Blindness 12/29/2017  . Normocytic normochromic anemia 12/29/2017  . Cellulitis and abscess of right leg 12/29/2017    Past Surgical History:  Procedure Laterality Date  . EYE SURGERY          Home Medications    Prior to Admission medications   Medication Sig Start Date End Date Taking? Authorizing Provider  atorvastatin (LIPITOR) 10 MG tablet Take 10 mg by mouth daily. 05/05/18  Yes [provider]  hydrochlorothiazide (HYDRODIURIL) 25 MG tablet Take 25 mg by mouth every morning. 05/14/18  Yes [provider]  insulin glargine (LANTUS) 100 UNIT/ML injection Inject 0.65 mLs (65 Units total) into the skin at bedtime. Patient taking differently: Inject 20 Units into the skin at bedtime.  12/24/17  Yes Fawze, Mina A, PA-C  lisinopril (PRINIVIL,ZESTRIL) 10 MG tablet Take 10 mg by mouth  every morning. 04/25/18  Yes [provider]  Semaglutide,0.25 or 0.5MG/DOS, 2 MG/1.5ML SOPN Inject 0.5 mg into the skin every Wednesday. 07/02/18  Yes [provider]  SYNJARDY XR 02-999 MG TB24 Take 1 tablet by mouth. 07/02/18  Yes [provider]  Blood Glucose Monitoring Suppl (PRODIGY AUTOCODE BLOOD GLUCOSE) w/Device KIT 1 Device by Does not apply route daily. 12/24/17   Fawze, Mina A, PA-C  PRODIGY LANCETS 28G MISC Use lancets to check blood glucose daily 12/24/17   Renita Papa, PA-C    Family History Family History  Problem Relation Age of Onset  . Diabetes Mellitus II Father     Social History Social History   Tobacco Use  . Smoking status: Never Smoker  . Smokeless tobacco: Never Used  Substance Use Topics  . Alcohol use: No    Frequency: Never  . Drug use: No     Allergies   Patient has no known allergies.   Review of Systems Review of Systems  All other systems reviewed and are negative.    Physical Exam Updated Vital Signs BP (!) 98/56   Pulse 94   Resp 10   Ht 5' 5"  (1.651 m)   Wt 108.9 kg   SpO2 95%   BMI 39.94 kg/m   Physical Exam  Constitutional: He is oriented to person, place, and time. He appears well-developed and well-nourished.  HENT:  Head: Normocephalic and atraumatic.  Neck: Normal range of  motion.  Cardiovascular: Normal rate, regular rhythm, normal heart sounds and intact distal pulses.  Pulmonary/Chest: Effort normal and breath sounds normal. No respiratory distress.  Abdominal: Soft. He exhibits no distension. There is no tenderness.  Musculoskeletal: Normal range of motion.  Neurological: He is alert and oriented to person, place, and time.  Skin: Skin is warm and dry.  Psychiatric: He has a normal mood and affect. Judgment normal.  Nursing note and vitals reviewed.    ED Treatments / Results  Labs (all labs ordered are listed, but only abnormal results are displayed) Labs Reviewed  BASIC  METABOLIC PANEL - Abnormal; Notable for the following components:      Result Value   Potassium 3.2 (*)    Glucose, Bld 108 (*)    All other components within normal limits  CBC WITH DIFFERENTIAL/PLATELET - Abnormal; Notable for the following components:   WBC 16.3 (*)    RBC 3.69 (*)    Hemoglobin 10.5 (*)    HCT 33.6 (*)    Neutro Abs 11.3 (*)    Eosinophils Absolute 2.1 (*)    Abs Immature Granulocytes 0.08 (*)    All other components within normal limits  TROPONIN I  TYPE AND SCREEN  ABO/RH    EKG EKG Interpretation  Date/Time:  Tuesday July 17 2018 09:06:57 EDT Ventricular Rate:  94 PR Interval:    QRS Duration: 90 QT Interval:  354 QTC Calculation: 443 R Axis:   43 Text Interpretation:  Sinus rhythm No old tracing to compare Confirmed by Jola Schmidt [phone]) on 07/17/2018 2:51:32 PM   Radiology Dg Chest 2 View  Result Date: 07/17/2018 CLINICAL DATA:  Intermittent shortness of breath for 1 week. EXAM: CHEST - 2 VIEW COMPARISON:  None. FINDINGS: Cardiomediastinal silhouette is normal. Mediastinal contours appear intact. There is no evidence of focal airspace consolidation, pleural effusion or pneumothorax. Osseous structures are without acute abnormality. Soft tissues are grossly normal. IMPRESSION: No active cardiopulmonary disease. Electronically Signed   By: Fidela Salisbury M.D.   On: 07/17/2018 10:23    Procedures Procedures (including critical care time)  Medications Ordered in ED Medications  oxyCODONE-acetaminophen (PERCOCET/ROXICET) 5-325 MG per tablet 1 tablet (1 tablet Oral Given 07/17/18 1028)     Initial Impression / Assessment and Plan / ED Course  I have reviewed the triage vital signs and the nursing notes.  Pertinent labs & imaging results that were available during my care of the patient were reviewed by me and considered in my medical decision making (see chart for details).     Patient is overall well-appearing.  His vital signs are  stable.  No hypoxia.  No increased work of breathing.  No indication for additional work-up at this time.  Patient is safe for discharge from the emergency department.  Close primary care follow-up.  He is encouraged to return to the emergency department for new or worsening symptoms  Final Clinical Impressions(s) / ED Diagnoses   Final diagnoses:  Generalized weakness  Neuropathy    ED Discharge Orders    None       Jola Schmidt, MD 07/17/18 1521

## 2018-07-17 NOTE — ED Notes (Signed)
Patient transported to X-ray 

## 2018-07-17 NOTE — ED Triage Notes (Signed)
Pt BIB [facility] from work d/t gen weakness that incr w/t 1 wk w/ SOB. Yesterday incr neuropathy in L thigh w/ CP was seen by PCP & started on gabapentin

## 2018-07-26 ENCOUNTER — Emergency Department (HOSPITAL_COMMUNITY)
Admission: EM | Admit: 2018-07-26 | Discharge: 2018-07-26 | Disposition: A | Discharge status: Home / Self Care | Payer: BLUE CROSS/BLUE SHIELD | Attending: Emergency Medicine | Admitting: Emergency Medicine

## 2018-07-26 ENCOUNTER — Other Ambulatory Visit: Payer: Self-pay

## 2018-07-26 ENCOUNTER — Encounter (HOSPITAL_COMMUNITY): Payer: Self-pay | Admitting: Emergency Medicine

## 2018-07-26 DIAGNOSIS — R42 Dizziness and giddiness: Secondary | ICD-10-CM

## 2018-07-26 DIAGNOSIS — E119 Type 2 diabetes mellitus without complications: Secondary | ICD-10-CM | POA: Insufficient documentation

## 2018-07-26 DIAGNOSIS — Z79899 Other long term (current) drug therapy: Secondary | ICD-10-CM | POA: Insufficient documentation

## 2018-07-26 DIAGNOSIS — Z794 Long term (current) use of insulin: Secondary | ICD-10-CM | POA: Diagnosis not present

## 2018-07-26 DIAGNOSIS — I10 Essential (primary) hypertension: Secondary | ICD-10-CM | POA: Diagnosis not present

## 2018-07-26 LAB — URINALYSIS, ROUTINE W REFLEX MICROSCOPIC
BILIRUBIN URINE: NEGATIVE
Hgb urine dipstick: NEGATIVE
KETONES UR: NEGATIVE mg/dL
Leukocytes, UA: NEGATIVE
NITRITE: NEGATIVE
PH: 5 (ref 5.0–8.0)
Protein, ur: NEGATIVE mg/dL
Specific Gravity, Urine: 1.021 (ref 1.005–1.030)

## 2018-07-26 LAB — BASIC METABOLIC PANEL
ANION GAP: 9 (ref 5–15)
BUN: 19 mg/dL (ref 6–20)
CHLORIDE: 106 mmol/L (ref 98–111)
CO2: 24 mmol/L (ref 22–32)
Calcium: 9.6 mg/dL (ref 8.9–10.3)
Creatinine, Ser: 1.17 mg/dL (ref 0.61–1.24)
GFR calc non Af Amer: >60 mL/min (ref >60)
Glucose, Bld: 93 mg/dL (ref 70–99)
Potassium: 3.8 mmol/L (ref 3.5–5.1)
SODIUM: 139 mmol/L (ref 135–145)

## 2018-07-26 LAB — I-STAT TROPONIN, ED: Troponin i, poc: 0 ng/mL (ref 0.00–0.08)

## 2018-07-26 LAB — CBC
HEMATOCRIT: 37.1 % — AB (ref 39.0–52.0)
HEMOGLOBIN: 10.8 g/dL — AB (ref 13.0–17.0)
MCH: 27.4 pg (ref 26.0–34.0)
MCHC: 29.1 g/dL — ABNORMAL LOW (ref 30.0–36.0)
MCV: 94.2 fL (ref 80.0–100.0)
NRBC: 0 % (ref 0.0–0.2)
Platelets: 386 10*3/uL (ref 150–400)
RBC: 3.94 MIL/uL — ABNORMAL LOW (ref 4.22–5.81)
RDW: 13.7 % (ref 11.5–15.5)
WBC: 11.2 10*3/uL — AB (ref 4.0–10.5)

## 2018-07-26 LAB — CBG MONITORING, ED: Glucose-Capillary: 89 mg/dL (ref 70–99)

## 2018-07-26 MED ORDER — SODIUM CHLORIDE 0.9 % IV BOLUS
1000.0000 mL | Freq: Once | INTRAVENOUS | Status: AC
  Administered 2018-07-26: 1000 mL via INTRAVENOUS

## 2018-07-26 NOTE — ED Triage Notes (Signed)
Pt c/o dizziness while at work.Denies loss of balance.  Co-worker called EMS. Pt denies pain, denies shortness of breath.

## 2018-07-26 NOTE — Discharge Instructions (Addendum)
You were evaluated today for dizziness.  Your lab work and EKG were negative.  I would suggest following up with your primary care provider for reevaluation.  Return to the ED with any new or worsening symptoms such as:  Get help right away if: You throw up (vomit) or have watery poop (diarrhea), and you cannot eat or drink anything. You have trouble: Talking. Walking. Swallowing. Using your arms, hands, or legs. You feel generally weak. You are not thinking clearly, or you have trouble forming sentences. A friend or family member may notice this. You have: Chest pain. Pain in your belly (abdomen). Shortness of breath. Sweating. Your vision changes. You are bleeding. You have a very bad headache. You have neck pain or a stiff neck. You have a fever.

## 2018-07-26 NOTE — ED Provider Notes (Signed)
[facility] DEPT Provider Note   CSN: [id number] Arrival date & time: 07/26/18  1143     History   Chief Complaint Chief Complaint  Patient presents with  . Dizziness    HPI Cory Mullins is a 37 y.o. male.  37yo male brought in by EMS for dizziness- described as feeling lightheaded, onset 9:30AM today. 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 Also reports midsternal chest discomfort described as a throbbing feeling around 9:30AM as well, states this resolved after a few minutes, denies associated nausea/vomiting/diaphoresis, states he has pain in his chest like this regularly, no changes in him normal chest discomfort episodes.      Past Medical History:  Diagnosis Date  . Blind   . Diabetes mellitus without complication ([place])   . Hypertension     Patient Active Problem List   Diagnosis Date Noted  . Cellulitis and abscess of right lower extremity 12/29/2017  . Uncontrolled type 2 diabetes mellitus with hyperglycemia ([place]) 12/29/2017  . Blindness 12/29/2017  . Normocytic normochromic anemia 12/29/2017  . Cellulitis and abscess of right leg 12/29/2017    Past Surgical History:  Procedure Laterality Date  . EYE SURGERY          Home Medications    Prior to Admission medications   Medication Sig Start Date End Date Taking? Authorizing Provider  atorvastatin (LIPITOR) 10 MG tablet Take 10 mg by mouth daily. 05/05/18  Yes [provider]  gabapentin (NEURONTIN) 100 MG capsule Take 100 mg by mouth 3 (three) times daily. 07/16/18  Yes [provider]  hydrochlorothiazide (HYDRODIURIL) 25 MG tablet Take 25 mg by mouth every morning. 05/14/18  Yes [provider]  ibuprofen (ADVIL,MOTRIN) 200 MG tablet Take 600 mg by mouth every 6 (six) hours as needed for  moderate pain.   Yes [provider]  insulin glargine (LANTUS) 100 UNIT/ML injection Inject 0.65 mLs (65 Units total) into the skin at bedtime. Patient taking differently: Inject 20 Units into the skin at bedtime.  12/24/17  Yes Fawze, Mina A, PA-C  lisinopril (PRINIVIL,ZESTRIL) 10 MG tablet Take 10 mg by mouth every morning. 04/25/18  Yes [provider]  Semaglutide,0.25 or 0.'5MG'$ /DOS, 2 OH/6.0VP SOPN Inject 0.5 mg into the skin every Wednesday. 07/02/18  Yes [provider]  SYNJARDY XR 02-999 MG TB24 Take 1 tablet by mouth 2 (two) times daily.  07/02/18  Yes [provider]  Blood Glucose Monitoring Suppl (PRODIGY AUTOCODE BLOOD GLUCOSE) w/Device KIT 1 Device by Does not apply route daily. 12/24/17   Fawze, Mina A, PA-C  PRODIGY LANCETS 28G MISC Use lancets to check blood glucose daily 12/24/17   Renita Papa, PA-C    Family History Family History  Problem Relation Age of Onset  . Diabetes Mellitus II Father   . Diabetes Father   . Hypertension Father   . Cancer Mother     Social History Social History   Tobacco Use  . Smoking status: Never Smoker  . Smokeless tobacco: Never Used  Substance Use Topics  . Alcohol use: Yes    Frequency: Never    Comment: occ  . Drug use: No     Allergies   Patient has no known allergies.   Review of Systems Review of Systems  Constitutional: Negative for  chills, diaphoresis and fever.  Respiratory: Negative for shortness of breath.   Cardiovascular: Positive for chest pain. Negative for leg swelling.  Gastrointestinal: Negative for nausea and vomiting.  Skin: Negative for rash and wound.  Allergic/Immunologic: Positive for immunocompromised state.  Neurological: Positive for light-headedness. Negative for weakness and headaches.  Hematological: Negative for adenopathy. Does not bruise/bleed easily.  Psychiatric/Behavioral: Negative for confusion.  All other systems reviewed and are  negative.    Physical Exam Updated Vital Signs BP 137/73   Pulse 95   Resp 19   Wt 104.3 kg   SpO2 95%   BMI 38.27 kg/m   Physical Exam  Constitutional: He is oriented to person, place, and time. He appears well-developed and well-nourished. No distress.  HENT:  Head: Normocephalic and atraumatic.  Eyes:  Legally blind, unable to control EOM, cornea cloudy  Cardiovascular: Normal rate, regular rhythm, normal heart sounds and intact distal pulses.  No murmur heard. Pulmonary/Chest: Effort normal and breath sounds normal. No respiratory distress.  Neurological: He is alert and oriented to person, place, and time. He has normal strength. No sensory deficit. GCS eye subscore is 4. GCS verbal subscore is 5. GCS motor subscore is 6.  Skin: Skin is warm and dry. He is not diaphoretic.  Psychiatric: He has a normal mood and affect. His behavior is normal.  Nursing note and vitals reviewed.    ED Treatments / Results  Labs (all labs ordered are listed, but only abnormal results are displayed) Labs Reviewed  URINALYSIS, ROUTINE W REFLEX MICROSCOPIC - Abnormal; Notable for the following components:      Result Value   Glucose, UA >=500 (*)    Bacteria, UA FEW (*)    All other components within normal limits  BASIC METABOLIC PANEL  CBC  CBG MONITORING, ED  I-STAT TROPONIN, ED    EKG None  Radiology No results found.  Procedures Procedures (including critical care time)  Medications Ordered in ED Medications  sodium chloride 0.9 % bolus 1,000 mL (1,000 mLs Intravenous New Bag/Given 07/26/18 1404)     Initial Impression / Assessment and Plan / ED Course  I have reviewed the triage vital signs and the nursing notes.  Pertinent labs & imaging results that were available during my care of the patient were reviewed by me and considered in my medical decision making (see chart for details).  Clinical Course as of Jul 26 1552  Thu Oct 17, 739  4563 37 year old  insulin-dependent diabetic male presents with complaint of feeling lightheaded today while at work.  His exam is unremarkable. Troponin is negative, doubt ACS as pain was described as his typical chest discomfort. CBG 89 (states 117 at time of symptoms), UA with glucose. Pending CBC and BMP. Given IV fluids. Care signed out to [facility], PA-C pending labs.    [LM]    Clinical Course User Index [LM] Tacy Learn, PA-C   Final Clinical Impressions(s) / ED Diagnoses   Final diagnoses:  None    ED Discharge Orders    None       Kerri, Asche, PA-C 07/26/18 1553    Carmin Muskrat, MD 07/26/18 [id number]

## 2018-07-26 NOTE — ED Triage Notes (Signed)
Per EMS- Patient reports that he is legally blind and felt dizzy at work around 0900 today. Patient denies  any pain, N/V/D, LOC, or pain.

## 2018-07-26 NOTE — ED Provider Notes (Signed)
Patient care assumed at shift change from Cory Melia, PA-C.  37 year old male who is insulin-dependent diabetic presented for evaluation of an episode of lightheadedness while at work today.  He states he did take his morning diabetes medicines and did not eat.  EKG normal sinus rhythm, orthostatics negative, troponin negative, urine with moderate glucose.  Plan if CBC and CMP negative for DC home with follow-up with primary care provider.  Physical Exam  BP 124/76   Pulse 95   Resp (!) 23   Wt 104.3 kg   SpO2 95%   BMI 38.27 kg/m   Physical Exam Physical Exam  Constitutional: Pt is oriented to person, place, and time. Pt appears well-developed and well-nourished. No distress.  HENT:  Head: Normocephalic and atraumatic.  Mouth/Throat: Oropharynx is clear and moist.  Eyes: Conjunctivae and EOM are normal. Pupils are equal, round, and reactive to light. No scleral icterus.  No horizontal, vertical or rotational nystagmus  Neck: Normal range of motion. Neck supple.  Full active and passive ROM without pain No midline or paraspinal tenderness No nuchal rigidity or meningeal signs  Cardiovascular: Normal rate, regular rhythm and intact distal pulses.   Pulmonary/Chest: Effort normal and breath sounds normal. No respiratory distress. Pt has no wheezes. No rales.  Abdominal: Soft. Bowel sounds are normal. There is no tenderness. There is no rebound and no guarding.  Musculoskeletal: Normal range of motion.  Lymphadenopathy:    No cervical adenopathy.  Neurological: Pt. is alert and oriented to person, place, and time. He has normal reflexes. No cranial nerve deficit.  Exhibits normal muscle tone. Coordination normal.  Mental Status:  Alert, oriented, thought content appropriate. Speech fluent without evidence of aphasia. Able to follow 2 step commands without difficulty.  Cranial Nerves:  II:  Peripheral visual fields grossly normal, pupils equal, round, reactive to light III,IV, VI:  ptosis not present, extra-ocular motions intact bilaterally  V,VII: smile symmetric, facial light touch sensation equal VIII: hearing grossly normal bilaterally  IX,X: midline uvula rise  XI: bilateral shoulder shrug equal and strong XII: midline tongue extension  Motor:  5/5 in upper and lower extremities bilaterally including strong and equal grip strength and dorsiflexion/plantar flexion Sensory: Pinprick and light touch normal in all extremities.  Deep Tendon Reflexes: 2+ and symmetric  Cerebellar: normal finger-to-nose with bilateral upper extremities Gait: normal gait and balance CV: distal pulses palpable throughout   Skin: Skin is warm and dry. No rash noted. Pt is not diaphoretic.  Psychiatric: Pt has a normal mood and affect. Behavior is normal. Judgment and thought content normal.  Nursing note and vitals reviewed. ED Course/Procedures   Clinical Course as of Jul 27 1623  Thu Jul 26, 2018  262 37 year old insulin-dependent diabetic male presents with complaint of feeling lightheaded today while at work.  His exam is unremarkable. Troponin is negative, doubt ACS as pain was described as his typical chest discomfort. CBG 89 (states 117 at time of symptoms), UA with glucose. Pending CBC and BMP. Given IV fluids. Care signed out to [facility], PA-C pending labs.    [LM]    Clinical Course User Index [LM] Cory, Mullins    Procedures  MDM  37 year old male who is insulin-dependent diabetic presented for evaluation of an episode of lightheadedness while at work today.  He states he did take his morning diabetes medicines and did not eat.  EKG normal sinus rhythm, orthostatics negative, troponin negative, urine with moderate glucose. Non-Focal neuro exam without neuro deficits.  CBC and CMP pending at shift change.  Patient states he feels improved with fluids.  Plan for DC home with follow-up with primary care provider if CBC and CMP unremarkable.  WBC 11.2 at baseline  compared to previous visits.  Hemoglobin 10.8 and at baseline compared to previous visits.  Patient with improvement dizziness on reevaluation.  Discussed with patient to follow-up with primary care provider for reevaluation.  Discussed strict return precautions with patient. Patient voiced understanding is agreeable for follow-up.      Haliey Romberg A, PA-C 07/26/18 1752    Tilden Fossa, MD 07/27/18 [phone]

## 2018-07-26 NOTE — ED Notes (Signed)
Called lab to obtain additional Lab specimens. RN unable to draw labs from IV site, and attempted blood draw unable to acquire sufficient about for order specimens

## 2018-08-18 ENCOUNTER — Inpatient Hospital Stay (HOSPITAL_COMMUNITY)
Admission: EM | Admit: 2018-08-18 | Discharge: 2018-08-22 | DRG: 871 | Disposition: A | Discharge status: Home / Self Care | Payer: BLUE CROSS/BLUE SHIELD | Attending: Internal Medicine | Admitting: Internal Medicine

## 2018-08-18 ENCOUNTER — Emergency Department (HOSPITAL_COMMUNITY): Payer: BLUE CROSS/BLUE SHIELD

## 2018-08-18 ENCOUNTER — Encounter (HOSPITAL_COMMUNITY): Payer: Self-pay

## 2018-08-18 DIAGNOSIS — K226 Gastro-esophageal laceration-hemorrhage syndrome: Secondary | ICD-10-CM | POA: Diagnosis present

## 2018-08-18 DIAGNOSIS — R7881 Bacteremia: Secondary | ICD-10-CM | POA: Diagnosis not present

## 2018-08-18 DIAGNOSIS — Z91018 Allergy to other foods: Secondary | ICD-10-CM | POA: Diagnosis not present

## 2018-08-18 DIAGNOSIS — K2921 Alcoholic gastritis with bleeding: Secondary | ICD-10-CM | POA: Diagnosis present

## 2018-08-18 DIAGNOSIS — F10921 Alcohol use, unspecified with intoxication delirium: Secondary | ICD-10-CM | POA: Diagnosis not present

## 2018-08-18 DIAGNOSIS — Z91048 Other nonmedicinal substance allergy status: Secondary | ICD-10-CM

## 2018-08-18 DIAGNOSIS — Z791 Long term (current) use of non-steroidal anti-inflammatories (NSAID): Secondary | ICD-10-CM

## 2018-08-18 DIAGNOSIS — D649 Anemia, unspecified: Secondary | ICD-10-CM | POA: Diagnosis present

## 2018-08-18 DIAGNOSIS — Z9181 History of falling: Secondary | ICD-10-CM

## 2018-08-18 DIAGNOSIS — R680 Hypothermia, not associated with low environmental temperature: Secondary | ICD-10-CM | POA: Diagnosis present

## 2018-08-18 DIAGNOSIS — D72829 Elevated white blood cell count, unspecified: Secondary | ICD-10-CM | POA: Diagnosis not present

## 2018-08-18 DIAGNOSIS — Z794 Long term (current) use of insulin: Secondary | ICD-10-CM | POA: Diagnosis not present

## 2018-08-18 DIAGNOSIS — E1165 Type 2 diabetes mellitus with hyperglycemia: Secondary | ICD-10-CM | POA: Diagnosis present

## 2018-08-18 DIAGNOSIS — N179 Acute kidney failure, unspecified: Secondary | ICD-10-CM | POA: Diagnosis not present

## 2018-08-18 DIAGNOSIS — Z8249 Family history of ischemic heart disease and other diseases of the circulatory system: Secondary | ICD-10-CM | POA: Diagnosis not present

## 2018-08-18 DIAGNOSIS — J690 Pneumonitis due to inhalation of food and vomit: Secondary | ICD-10-CM

## 2018-08-18 DIAGNOSIS — B9561 Methicillin susceptible Staphylococcus aureus infection as the cause of diseases classified elsewhere: Secondary | ICD-10-CM | POA: Diagnosis not present

## 2018-08-18 DIAGNOSIS — H548 Legal blindness, as defined in USA: Secondary | ICD-10-CM | POA: Diagnosis present

## 2018-08-18 DIAGNOSIS — F10229 Alcohol dependence with intoxication, unspecified: Secondary | ICD-10-CM | POA: Diagnosis present

## 2018-08-18 DIAGNOSIS — F10129 Alcohol abuse with intoxication, unspecified: Secondary | ICD-10-CM | POA: Diagnosis not present

## 2018-08-18 DIAGNOSIS — F1020 Alcohol dependence, uncomplicated: Secondary | ICD-10-CM | POA: Diagnosis not present

## 2018-08-18 DIAGNOSIS — R918 Other nonspecific abnormal finding of lung field: Secondary | ICD-10-CM | POA: Diagnosis not present

## 2018-08-18 DIAGNOSIS — I10 Essential (primary) hypertension: Secondary | ICD-10-CM | POA: Diagnosis present

## 2018-08-18 DIAGNOSIS — A4101 Sepsis due to Methicillin susceptible Staphylococcus aureus: Secondary | ICD-10-CM | POA: Diagnosis present

## 2018-08-18 DIAGNOSIS — R7989 Other specified abnormal findings of blood chemistry: Secondary | ICD-10-CM | POA: Diagnosis present

## 2018-08-18 DIAGNOSIS — H547 Unspecified visual loss: Secondary | ICD-10-CM

## 2018-08-18 DIAGNOSIS — E872 Acidosis: Secondary | ICD-10-CM | POA: Diagnosis present

## 2018-08-18 DIAGNOSIS — Z833 Family history of diabetes mellitus: Secondary | ICD-10-CM | POA: Diagnosis not present

## 2018-08-18 DIAGNOSIS — R6520 Severe sepsis without septic shock: Secondary | ICD-10-CM | POA: Insufficient documentation

## 2018-08-18 DIAGNOSIS — H543 Unqualified visual loss, both eyes: Secondary | ICD-10-CM | POA: Diagnosis not present

## 2018-08-18 DIAGNOSIS — A419 Sepsis, unspecified organism: Secondary | ICD-10-CM | POA: Diagnosis present

## 2018-08-18 DIAGNOSIS — Z79899 Other long term (current) drug therapy: Secondary | ICD-10-CM | POA: Diagnosis not present

## 2018-08-18 DIAGNOSIS — R112 Nausea with vomiting, unspecified: Secondary | ICD-10-CM

## 2018-08-18 DIAGNOSIS — E876 Hypokalemia: Secondary | ICD-10-CM | POA: Diagnosis present

## 2018-08-18 DIAGNOSIS — E119 Type 2 diabetes mellitus without complications: Secondary | ICD-10-CM | POA: Diagnosis not present

## 2018-08-18 LAB — I-STAT ARTERIAL BLOOD GAS, ED
ACID-BASE DEFICIT: 5 mmol/L — AB (ref 0.0–2.0)
Bicarbonate: 19.1 mmol/L — ABNORMAL LOW (ref 20.0–28.0)
O2 Saturation: 89 %
PH ART: 7.37 (ref 7.350–7.450)
PO2 ART: 58 mmHg — AB (ref 83.0–108.0)
Patient temperature: 98.9
TCO2: 20 mmol/L — ABNORMAL LOW (ref 22–32)
pCO2 arterial: 33.2 mmHg (ref 32.0–48.0)

## 2018-08-18 LAB — COMPREHENSIVE METABOLIC PANEL
ALBUMIN: 3.6 g/dL (ref 3.5–5.0)
ALK PHOS: 58 U/L (ref 38–126)
ALT: 16 U/L (ref 0–44)
ANION GAP: 16 — AB (ref 5–15)
AST: 25 U/L (ref 15–41)
BILIRUBIN TOTAL: 0.4 mg/dL (ref 0.3–1.2)
BUN: 22 mg/dL — AB (ref 6–20)
CALCIUM: 9.4 mg/dL (ref 8.9–10.3)
CO2: 18 mmol/L — ABNORMAL LOW (ref 22–32)
Chloride: 103 mmol/L (ref 98–111)
Creatinine, Ser: 1.95 mg/dL — ABNORMAL HIGH (ref 0.61–1.24)
GFR calc Af Amer: 49 mL/min — ABNORMAL LOW (ref >60)
GFR calc non Af Amer: 42 mL/min — ABNORMAL LOW (ref >60)
GLUCOSE: 185 mg/dL — AB (ref 70–99)
Potassium: 3.3 mmol/L — ABNORMAL LOW (ref 3.5–5.1)
SODIUM: 137 mmol/L (ref 135–145)
TOTAL PROTEIN: 7.8 g/dL (ref 6.5–8.1)

## 2018-08-18 LAB — ACETAMINOPHEN LEVEL

## 2018-08-18 LAB — RAPID URINE DRUG SCREEN, HOSP PERFORMED
AMPHETAMINES: NOT DETECTED
Barbiturates: NOT DETECTED
Benzodiazepines: NOT DETECTED
Cocaine: NOT DETECTED
OPIATES: NOT DETECTED
TETRAHYDROCANNABINOL: NOT DETECTED

## 2018-08-18 LAB — URINALYSIS, ROUTINE W REFLEX MICROSCOPIC
BILIRUBIN URINE: NEGATIVE
GLUCOSE, UA: NEGATIVE mg/dL
Ketones, ur: NEGATIVE mg/dL
LEUKOCYTES UA: NEGATIVE
NITRITE: NEGATIVE
PROTEIN: 30 mg/dL — AB
SPECIFIC GRAVITY, URINE: 1.013 (ref 1.005–1.030)
pH: 5 (ref 5.0–8.0)

## 2018-08-18 LAB — I-STAT TROPONIN, ED: TROPONIN I, POC: 0.17 ng/mL — AB (ref 0.00–0.08)

## 2018-08-18 LAB — I-STAT CHEM 8, ED
BUN: 24 mg/dL — ABNORMAL HIGH (ref 6–20)
Calcium, Ion: 1.17 mmol/L (ref 1.15–1.40)
Chloride: 104 mmol/L (ref 98–111)
Creatinine, Ser: 2.1 mg/dL — ABNORMAL HIGH (ref 0.61–1.24)
Glucose, Bld: 183 mg/dL — ABNORMAL HIGH (ref 70–99)
HEMATOCRIT: 47 % (ref 39.0–52.0)
Hemoglobin: 16 g/dL (ref 13.0–17.0)
POTASSIUM: 3.4 mmol/L — AB (ref 3.5–5.1)
Sodium: 141 mmol/L (ref 135–145)
TCO2: 21 mmol/L — AB (ref 22–32)

## 2018-08-18 LAB — I-STAT CG4 LACTIC ACID, ED
LACTIC ACID, VENOUS: 6.04 mmol/L — AB (ref 0.5–1.9)
LACTIC ACID, VENOUS: 5.17 mmol/L — AB (ref 0.5–1.9)

## 2018-08-18 LAB — CBC WITH DIFFERENTIAL/PLATELET
Abs Immature Granulocytes: 0.16 10*3/uL — ABNORMAL HIGH (ref 0.00–0.07)
BASOS PCT: 0 %
Basophils Absolute: 0.1 10*3/uL (ref 0.0–0.1)
EOS ABS: 0 10*3/uL (ref 0.0–0.5)
Eosinophils Relative: 0 %
HCT: 47.3 % (ref 39.0–52.0)
HEMOGLOBIN: 14.3 g/dL (ref 13.0–17.0)
IMMATURE GRANULOCYTES: 1 %
Lymphocytes Relative: 3 %
Lymphs Abs: 0.9 10*3/uL (ref 0.7–4.0)
MCH: 27.5 pg (ref 26.0–34.0)
MCHC: 30.2 g/dL (ref 30.0–36.0)
MCV: 91 fL (ref 80.0–100.0)
MONOS PCT: 7 %
Monocytes Absolute: 2.1 10*3/uL — ABNORMAL HIGH (ref 0.1–1.0)
NEUTROS PCT: 89 %
Neutro Abs: 25.4 10*3/uL — ABNORMAL HIGH (ref 1.7–7.7)
Platelets: 262 10*3/uL (ref 150–400)
RBC: 5.2 MIL/uL (ref 4.22–5.81)
RDW: 12.2 % (ref 11.5–15.5)
WBC: 28.8 10*3/uL — ABNORMAL HIGH (ref 4.0–10.5)
nRBC: 0 % (ref 0.0–0.2)

## 2018-08-18 LAB — CBG MONITORING, ED
Glucose-Capillary: 171 mg/dL — ABNORMAL HIGH (ref 70–99)
Glucose-Capillary: 158 mg/dL — ABNORMAL HIGH (ref 70–99)

## 2018-08-18 LAB — CK: Total CK: 847 U/L — ABNORMAL HIGH (ref 49–397)

## 2018-08-18 LAB — TROPONIN I: Troponin I: 0.13 ng/mL (ref <0.03)

## 2018-08-18 LAB — LIPASE, BLOOD: Lipase: 45 U/L (ref 11–51)

## 2018-08-18 LAB — SALICYLATE LEVEL: Salicylate Lvl: <7 mg/dL (ref 2.8–30.0)

## 2018-08-18 LAB — TYPE AND SCREEN
ABO/RH(D): B NEG
ANTIBODY SCREEN: NEGATIVE

## 2018-08-18 LAB — POC OCCULT BLOOD, ED: Fecal Occult Bld: NEGATIVE

## 2018-08-18 LAB — TSH: TSH: 1.222 u[IU]/mL (ref 0.350–4.500)

## 2018-08-18 LAB — ETHANOL: Alcohol, Ethyl (B): 187 mg/dL — ABNORMAL HIGH (ref <10)

## 2018-08-18 MED ORDER — INSULIN ASPART 100 UNIT/ML ~~LOC~~ SOLN: 0.0000 [IU] | Freq: Every day | SUBCUTANEOUS | Status: DC

## 2018-08-18 MED ORDER — INSULIN ASPART 100 UNIT/ML ~~LOC~~ SOLN
0.0000 [IU] | Freq: Three times a day (TID) | SUBCUTANEOUS | Status: DC
  Administered 2018-08-19: 3 [IU] via SUBCUTANEOUS
  Administered 2018-08-19: 2 [IU] via SUBCUTANEOUS
  Administered 2018-08-19: 3 [IU] via SUBCUTANEOUS
  Administered 2018-08-20: 2 [IU] via SUBCUTANEOUS
  Administered 2018-08-20 (×2): 1 [IU] via SUBCUTANEOUS
  Administered 2018-08-21: 2 [IU] via SUBCUTANEOUS
  Administered 2018-08-21: 1 [IU] via SUBCUTANEOUS
  Administered 2018-08-22: 2 [IU] via SUBCUTANEOUS

## 2018-08-18 MED ORDER — SODIUM CHLORIDE 0.9 % IV BOLUS
1000.0000 mL | Freq: Once | INTRAVENOUS | Status: AC
  Administered 2018-08-18: 1000 mL via INTRAVENOUS

## 2018-08-18 MED ORDER — FOLIC ACID 5 MG/ML IJ SOLN
1.0000 mg | Freq: Every day | INTRAMUSCULAR | Status: DC
  Filled 2018-08-18: qty 0.2

## 2018-08-18 MED ORDER — SODIUM CHLORIDE 0.9 % IV SOLN
INTRAVENOUS | Status: DC
  Administered 2018-08-18: 17:00:00 via INTRAVENOUS

## 2018-08-18 MED ORDER — PANTOPRAZOLE SODIUM 40 MG IV SOLR
40.0000 mg | Freq: Two times a day (BID) | INTRAVENOUS | Status: DC
  Administered 2018-08-18 – 2018-08-21 (×6): 40 mg via INTRAVENOUS
  Filled 2018-08-18 (×6): qty 40

## 2018-08-18 MED ORDER — VANCOMYCIN HCL IN DEXTROSE 1-5 GM/200ML-% IV SOLN
1000.0000 mg | Freq: Once | INTRAVENOUS | Status: AC
  Administered 2018-08-18: 1000 mg via INTRAVENOUS
  Filled 2018-08-18: qty 200

## 2018-08-18 MED ORDER — METOCLOPRAMIDE HCL 5 MG/ML IJ SOLN
10.0000 mg | Freq: Once | INTRAMUSCULAR | Status: AC
  Administered 2018-08-18: 10 mg via INTRAVENOUS
  Filled 2018-08-18: qty 2

## 2018-08-18 MED ORDER — DIPHENHYDRAMINE HCL 50 MG/ML IJ SOLN
12.5000 mg | Freq: Once | INTRAMUSCULAR | Status: AC
  Administered 2018-08-18: 12.5 mg via INTRAVENOUS
  Filled 2018-08-18: qty 1

## 2018-08-18 MED ORDER — VANCOMYCIN HCL 10 G IV SOLR
1250.0000 mg | INTRAVENOUS | Status: DC
  Filled 2018-08-18: qty 1250

## 2018-08-18 MED ORDER — THIAMINE HCL 100 MG/ML IJ SOLN: 100.0000 mg | Freq: Every day | INTRAMUSCULAR | Status: DC

## 2018-08-18 MED ORDER — ONDANSETRON HCL 4 MG/2ML IJ SOLN
4.0000 mg | Freq: Four times a day (QID) | INTRAMUSCULAR | Status: DC | PRN
  Administered 2018-08-18 – 2018-08-19 (×3): 4 mg via INTRAVENOUS
  Filled 2018-08-18 (×3): qty 2

## 2018-08-18 MED ORDER — SODIUM CHLORIDE 0.9 % IV SOLN: INTRAVENOUS | Status: DC

## 2018-08-18 MED ORDER — ONDANSETRON HCL 4 MG PO TABS: 4.0000 mg | ORAL_TABLET | Freq: Four times a day (QID) | ORAL | Status: DC | PRN

## 2018-08-18 MED ORDER — LORAZEPAM 2 MG/ML IJ SOLN: 0.5000 mg | INTRAMUSCULAR | Status: DC | PRN

## 2018-08-18 MED ORDER — PIPERACILLIN-TAZOBACTAM 3.375 G IVPB
3.3750 g | Freq: Three times a day (TID) | INTRAVENOUS | Status: DC
  Administered 2018-08-18 – 2018-08-19 (×2): 3.375 g via INTRAVENOUS
  Filled 2018-08-18 (×2): qty 50

## 2018-08-18 MED ORDER — PROMETHAZINE HCL 25 MG/ML IJ SOLN: 12.5000 mg | Freq: Four times a day (QID) | INTRAMUSCULAR | Status: DC | PRN

## 2018-08-18 MED ORDER — IPRATROPIUM-ALBUTEROL 0.5-2.5 (3) MG/3ML IN SOLN
3.0000 mL | Freq: Four times a day (QID) | RESPIRATORY_TRACT | Status: DC
  Administered 2018-08-19 (×3): 3 mL via RESPIRATORY_TRACT
  Filled 2018-08-18 (×3): qty 3

## 2018-08-18 MED ORDER — PIPERACILLIN-TAZOBACTAM 3.375 G IVPB 30 MIN
3.3750 g | Freq: Once | INTRAVENOUS | Status: AC
  Administered 2018-08-18: 3.375 g via INTRAVENOUS
  Filled 2018-08-18: qty 50

## 2018-08-18 NOTE — H&P (Signed)
[facility]                                                                                    Patient Demographics  Cory Mullins, is a 37 y.o. male  CSN: [id number]  MRN: [id number]  DOB - 06-22-1981  Admit Date - 08/18/2018  Outpatient Primary MD for the patient is Juanda Chance   With History of -  Past Medical History:  Diagnosis Date  . Blind   . Diabetes mellitus without complication ([place])   . Hypertension       Past Surgical History:  Procedure Laterality Date  . EYE SURGERY      in for   Chief Complaint  Patient presents with  . GI Bleeding     HPI  Cory Mullins  is a 37 y.o. male, with past medical history significant for blindness, hypertension and diabetes mellitus who presents today with altered mental status .  As per his roommate that the patient has been falling a lot in the last few days and has been drinking more alcohol than usual.  He was found today on the floor with coffee-ground emesis, unresponsive.  His blood sugar on arrival was 172 and his pulse ox was 86% on room air.  The patient was noted to be hypothermic.  In the emergency room CT of the head was negative but his chest x-ray showed right lower lobe pneumonia and his white blood cell count was elevated.  His troponins were slightly elevated and he was in acute renal failure.  Patient is admitted for aspiration pneumonia with sepsis.    Review of Systems    Unable to obtain due to patient's condition  Social History Social History   Tobacco Use  . Smoking status: Never Smoker  . Smokeless tobacco: Never Used  Substance Use Topics  . Alcohol use: Yes    Frequency: Never    Comment: occ     Family History Family History  Problem Relation Age of Onset  . Diabetes Mellitus II Father   . Diabetes Father   . Hypertension Father   . Cancer Mother      Prior to Admission medications   Medication Sig Start Date End Date Taking? Authorizing Provider   atorvastatin (LIPITOR) 10 MG tablet Take 10 mg by mouth daily. 05/05/18   [provider]  Blood Glucose Monitoring Suppl (PRODIGY AUTOCODE BLOOD GLUCOSE) w/Device KIT 1 Device by Does not apply route daily. 12/24/17   Fawze, Mina A, PA-C  gabapentin (NEURONTIN) 100 MG capsule Take 100 mg by mouth 3 (three) times daily. 07/16/18   [provider]  hydrochlorothiazide (HYDRODIURIL) 25 MG tablet Take 25 mg by mouth every morning. 05/14/18   [provider]  ibuprofen (ADVIL,MOTRIN) 200 MG tablet Take 600 mg by mouth every 6 (six) hours as needed for moderate pain.    [provider]  insulin glargine (LANTUS) 100 UNIT/ML injection Inject 0.65 mLs (65 Units total) into the skin at bedtime. Patient taking differently: Inject 20 Units into the skin at bedtime.  12/24/17   Fawze, Mina A, PA-C  lisinopril (PRINIVIL,ZESTRIL) 10 MG tablet Take 10  mg by mouth every morning. 04/25/18   [provider]  PRODIGY LANCETS 28G MISC Use lancets to check blood glucose daily 12/24/17   Rodell Perna A, PA-C  Semaglutide,0.25 or 0.5MG/DOS, 2 MG/1.5ML SOPN Inject 0.5 mg into the skin every Wednesday. 07/02/18   [provider]  SYNJARDY XR 02-999 MG TB24 Take 1 tablet by mouth 2 (two) times daily.  07/02/18   [provider]    No Known Allergies  Physical Exam  Vitals  Blood pressure 119/64, pulse (!) 103, temperature (!) 96.6 F (35.9 C), temperature source Rectal, resp. rate (!) 22, weight 104.3 kg, SpO2 93 %.   1. General well-developed, well-nourished male, noncontributory  2.  Obtunded.  3.  Unable to perform neuro due to decreased mentation.  4. Ears and Eyes appear Normal, Conjunctivae clear, PERRLA.  Dry oral Mucosa.  5. Supple Neck, No JVD, No cervical lymphadenopathy appriciated, No Carotid Bruits.  6. Symmetrical Chest wall movement, increased breath sounds at the bases right more than left.  7. RRR, No Gallops, Rubs or Murmurs, No  Parasternal Heave.  8. Positive Bowel Sounds, Abdomen Soft, Non tender, No organomegaly appriciated,No rebound -guarding or rigidity.  9.  No Cyanosis, Normal Skin Turgor, No Skin Rash or Bruise.  10. Good muscle tone,  joints appear normal , no effusions, Normal ROM.    Data Review  CBC Recent Labs  Lab 08/18/18 1531 08/18/18 1540  WBC 28.8*  --   HGB 14.3 16.0  HCT 47.3 47.0  PLT 262  --   MCV 91.0  --   MCH 27.5  --   MCHC 30.2  --   RDW 12.2  --   LYMPHSABS 0.9  --   MONOABS 2.1*  --   EOSABS 0.0  --   BASOSABS 0.1  --    ------------------------------------------------------------------------------------------------------------------  Chemistries  Recent Labs  Lab 08/18/18 1531 08/18/18 1540  NA 137 141  K 3.3* 3.4*  CL 103 104  CO2 18*  --   GLUCOSE 185* 183*  BUN 22* 24*  CREATININE 1.95* 2.10*  CALCIUM 9.4  --   AST 25  --   ALT 16  --   ALKPHOS 58  --   BILITOT 0.4  --    ------------------------------------------------------------------------------------------------------------------ estimated creatinine clearance is 54.1 mL/min (A) (by C-G formula based on SCr of 2.1 mg/dL (H)). ------------------------------------------------------------------------------------------------------------------ Recent Labs    08/18/18 1531  TSH 1.222     Coagulation profile No results for input(s): INR, PROTIME in the last 168 hours. ------------------------------------------------------------------------------------------------------------------- No results for input(s): DDIMER in the last 72 hours. -------------------------------------------------------------------------------------------------------------------  Cardiac Enzymes No results for input(s): CKMB, TROPONINI, MYOGLOBIN in the last 168 hours.  Invalid input(s): CK ------------------------------------------------------------------------------------------------------------------ Invalid  input(s): POCBNP   ---------------------------------------------------------------------------------------------------------------  Urinalysis    Component Value Date/Time   COLORURINE YELLOW 08/18/2018 [place] 08/18/2018 1608   LABSPEC 1.013 08/18/2018 1608   PHURINE 5.0 08/18/2018 1608   GLUCOSEU NEGATIVE 08/18/2018 1608   HGBUR SMALL (A) 08/18/2018 1608   BILIRUBINUR NEGATIVE 08/18/2018 1608   KETONESUR NEGATIVE 08/18/2018 1608   PROTEINUR 30 (A) 08/18/2018 1608   NITRITE NEGATIVE 08/18/2018 1608   LEUKOCYTESUR NEGATIVE 08/18/2018 1608    ----------------------------------------------------------------------------------------------------------------   Imaging results:   Ct Head Wo Contrast  Result Date: 08/18/2018 CLINICAL DATA:  Altered level of consciousness. EXAM: CT HEAD WITHOUT CONTRAST TECHNIQUE: Contiguous axial images were obtained from the base of the skull through the vertex without intravenous contrast. COMPARISON:  None. FINDINGS: Brain: No evidence of acute infarction, hemorrhage, hydrocephalus, extra-axial collection or mass lesion/mass effect. Vascular: No hyperdense vessel or unexpected calcification. Skull: Normal. Negative for fracture or focal lesion. Sinuses/Orbits: No acute finding. Other: None. IMPRESSION: Normal head CT. Electronically Signed   By: Marijo Conception, M.D.   On: 08/18/2018 16:52   Dg Chest Port 1 View  Result Date: 08/18/2018 CLINICAL DATA:  Hypoxia. EXAM: PORTABLE CHEST 1 VIEW COMPARISON:  Radiographs of July 17, 2018. FINDINGS: The heart size and mediastinal contours are within normal limits. No pneumothorax or pleural effusion is noted. Left lung is clear. New right midlung and basilar opacity is noted concerning for possible pneumonia. The visualized skeletal structures are unremarkable. IMPRESSION: New right lung opacity is noted concerning for pneumonia. Electronically Signed   By: Marijo Conception, M.D.   On: 08/18/2018  16:23   Dg Shoulder Right Port  Result Date: 08/18/2018 CLINICAL DATA:  Right shoulder pain.  Unresponsive patient. EXAM: PORTABLE RIGHT SHOULDER COMPARISON:  None. FINDINGS: Single AP view of the shoulder demonstrates no grossly displaced fracture. Visualized right hemithorax is unremarkable. IMPRESSION: No grossly displaced fracture of the right shoulder. Recommend dedicated views of the shoulder when patient clinically able to exclude the possibility of a subtle fracture or dislocation. Electronically Signed   By: Lovey Newcomer M.D.   On: 08/18/2018 18:01   Ct Renal Stone Study  Result Date: 08/18/2018 CLINICAL DATA:  Flank pain, vomiting, acute renal failure. Coffee ground emesis since this morning. EXAM: CT ABDOMEN AND PELVIS WITHOUT CONTRAST TECHNIQUE: Multidetector CT imaging of the abdomen and pelvis was performed following the standard protocol without IV contrast. COMPARISON:  None. FINDINGS: Lower chest: Patchy dense consolidations within the RIGHT lower lobe, incompletely imaged. Hepatobiliary: Streak artifact limits characterization of the liver and gallbladder. However, there is no focal mass or lesion identified within the liver and no secondary signs of acute cholecystitis. No evidence of bile duct dilatation. Pancreas: Unremarkable. No pancreatic ductal dilatation or surrounding inflammatory changes. Spleen: Normal in size without focal abnormality. Adrenals/Urinary Tract: Adrenal glands appear normal. Kidneys are unremarkable without mass, stone or hydronephrosis. No ureteral or bladder calculi identified. Bladder appears normal. Stomach/Bowel: No dilated large or small bowel loops. No evidence of bowel wall inflammation. Appendix is normal. Stomach is unremarkable. Vascular/Lymphatic: No significant vascular findings are present. No enlarged abdominal or pelvic lymph nodes. Reproductive: Prostate is unremarkable. Other: No free fluid or abscess collection. No free intraperitoneal air.  Musculoskeletal: No acute or suspicious osseous finding. IMPRESSION: 1. Patchy dense consolidations within the RIGHT lower lung, incompletely imaged, compatible with pneumonia or aspiration. 2. No acute findings within the abdomen or pelvis, with mild study limitations detailed above. No bowel obstruction or evidence of bowel wall inflammation seen. No evidence of acute solid organ abnormality. No renal or ureteral calculi. No free fluid or abscess. Appendix is normal. Electronically Signed   By: Franki Cabot M.D.   On: 08/18/2018 17:37    My personal review of EKG: Normal sinus rhythm at 99 bpm, no acute changes  Assessment & Plan  Sepsis altered mental status probably due to pneumonia IV hydration IV vancomycin and Zosyn Place in stepdown NPO  Right lower lobe pneumonia IV vancomycin and Zosyn Nebulizer treatments  Elevated troponin Will be secondary to  renal failure Check echocardiogram  Diabetes mellitus Continue with insulin sliding scale IV fluids  Acute renal failure IV hydration and monitor creatinine  Blindness  GI bleed Continue with IV Protonix  probably gastritis due to alcoholism  Alcoholism PRN Ativan Thiamine/folate    DVT Prophylaxis SCDs  AM Labs Ordered, also please review Full Orders  Family Communication: Discussed with his roommate who is the emergency contact.  Code Status full  Disposition Plan: Undetermined  Time spent in minutes : 41 minutes  Condition critical   _0 @

## 2018-08-18 NOTE — Progress Notes (Signed)
Patient was jsut admitted to SDU this evening for sepsis suspected secondary to PNA, complicated by AKI, AMS, and coffee-ground emesis.   RN asked that patient be re-evaluated, concerned that he needs to be in ICU.   He is slightly tachycardic with stable BP and no respiratory distress. He is somnolent but easy to wake and answers basic questions appropriately.   Plan to give additional liter to complete 30 cc/kg given sepsis with lactate >4 and check ABG. If ABG is okay, agree with the ED physician and admitting physician that he is appropriate for SDU.

## 2018-08-18 NOTE — ED Notes (Signed)
Paged Admitting to [facility]

## 2018-08-18 NOTE — ED Triage Notes (Addendum)
Pt from home via ems; pt has coffee ground emesis that began this am; pt's romate found pt on the floor; pt c/o R shoulder pain and stomach pain; denies diarrhea; VSS w/ ems, pt blind; answering questing appropriately w/ ems; given 4 Zofran PTA; pt's roommate also reports an increase in falls this past week; pt very lethargic on arrival  CBG 172 116/48 HR 82 normal sinus 99% RA

## 2018-08-18 NOTE — ED Notes (Signed)
Re-paged Admitting (Dr. Lenis Dickinson) to [facility], no return call after previous page

## 2018-08-18 NOTE — ED Notes (Addendum)
Spoke to admitting Dr. Val Eagle, stated he would look into increasing pt Level of care to ICU.  Ordered additional fluids.

## 2018-08-18 NOTE — ED Notes (Signed)
Pt's mother called, pt gave RN verbal permission to speak to mother.  Explained situations to mother on speaker phone.  [place], please call for medical issues.

## 2018-08-18 NOTE — ED Notes (Signed)
Per ems, pt's apartment in very poor condition; consider social work consult

## 2018-08-18 NOTE — ED Notes (Signed)
Pt projectile-vomited in CT

## 2018-08-18 NOTE — Progress Notes (Signed)
Pharmacy Antibiotic Note  Cory Mullins is a 37 y.o. male admitted on 08/18/2018 with sepsis.  Pharmacy has been consulted for vancomycin and Zosyn dosing. Pt has received one-time doses already per EDP, will give additional one-time vancomycin dose to complete load.  Plan: -Vancomycin 1000mg  IV x1 repeat to complete load then 1250mg  IV q24h -Zosyn 3.375g IV q8h -Monitor LOT, cultures, renal function -Vancomycin level as needed   Weight: 230 lb (104.3 kg)  Temp (24hrs), Avg:96.6 F (35.9 C), Min:96.6 F (35.9 C), Max:96.6 F (35.9 C)  Recent Labs  Lab 08/18/18 1531 08/18/18 1540  WBC 28.8*  --   CREATININE 1.95* 2.10*  LATICACIDVEN  --  6.04*    Estimated Creatinine Clearance: 54.1 mL/min (A) (by C-G formula based on SCr of 2.1 mg/dL (H)).    No Known Allergies  Antimicrobials this admission: Vancomycin 11/9 >>  Zosyn 11/9 >>   Dose adjustments this admission: none  Microbiology results: sent  Thank you for allowing pharmacy to be a part of this patient's care.  Fredonia Highland, PharmD, BCPS Clinical Pharmacist [phone] Please check AMION for all [facility] Pharm[facility] numbers 08/18/2018

## 2018-08-18 NOTE — ED Notes (Signed)
bair hugger applied.

## 2018-08-18 NOTE — ED Provider Notes (Signed)
MSE was initiated and I personally evaluated the patient and placed orders (if any) at  3:08 PM on August 18, 2018.  The patient appears stable so that the remainder of the MSE may be completed by another provider. Patient brought by EMS he was found on the floor EMS found vomitus on him which they thought was coffee-ground.  Patient denies pain anywhere.  He admits to drinking alcohol yesterday.  On exam patient is alert mucous membranes dry there is black dried material on his tongue lungs clear to auscultation heart regular rate and rhythm abdomen obese, nontender.  Rectum normal tone brown stool no gross blood extremities without edema skin with thickened brawny chronic appearing on extremities    Doug Sou, MD 08/18/18 1701

## 2018-08-18 NOTE — ED Notes (Signed)
Afiya (Emergency contact for any major changes) [phone]   Armistead Sult (Pt's mother) [phone]

## 2018-08-18 NOTE — ED Notes (Signed)
Temp 99.1, turned off warmer.

## 2018-08-18 NOTE — ED Notes (Signed)
Patient transported to CT 

## 2018-08-18 NOTE — ED Notes (Signed)
Placed pt on 2L NNC due to low O2 when sleeping. O2 returned to normal limits, will continue to monitor

## 2018-08-18 NOTE — ED Notes (Signed)
Dr. Silverio Lay aware of elevated lactic and Troponin

## 2018-08-18 NOTE — ED Provider Notes (Signed)
[facility] EMERGENCY DEPARTMENT Provider Note   CSN: [id number] Arrival date & time: 08/18/18  1441     History   Chief Complaint Chief Complaint  Patient presents with  . GI Bleeding    HPI Cory Mullins is a 37 y.o. male history of blindness, diabetes, hypertension here presenting with altered mental status, falls, lethargy.  Patient lives with a roommate who is also blind.  Per the roommate, patient has been falling more often over the last several days.  He also has been drinking some alcohol and today was found on the floor with possible coffee-ground emesis.  Patient is very altered and unable to give much history.  EMS brought him here and given 4 mg of Zofran and CBG was 172 on arrival.  Patient was noted to have pulse ox of 99% per EMS but was 86% on room air in the ED.  Patient also hypothermic 96.8 on arrival.  Patient unable to give much meaningful history.   The history is provided by the EMS personnel and a friend.    Past Medical History:  Diagnosis Date  . Blind   . Diabetes mellitus without complication ([place])   . Hypertension     Patient Active Problem List   Diagnosis Date Noted  . Cellulitis and abscess of right lower extremity 12/29/2017  . Uncontrolled type 2 diabetes mellitus with hyperglycemia ([place]) 12/29/2017  . Blindness 12/29/2017  . Normocytic normochromic anemia 12/29/2017  . Cellulitis and abscess of right leg 12/29/2017    Past Surgical History:  Procedure Laterality Date  . EYE SURGERY          Home Medications    Prior to Admission medications   Medication Sig Start Date End Date Taking? Authorizing Provider  atorvastatin (LIPITOR) 10 MG tablet Take 10 mg by mouth daily. 05/05/18   [provider]  Blood Glucose Monitoring Suppl (PRODIGY AUTOCODE BLOOD GLUCOSE) w/Device KIT 1 Device by Does not apply route daily. 12/24/17   Fawze, Mina A, PA-C  gabapentin (NEURONTIN) 100 MG capsule Take 100 mg by mouth 3  (three) times daily. 07/16/18   [provider]  hydrochlorothiazide (HYDRODIURIL) 25 MG tablet Take 25 mg by mouth every morning. 05/14/18   [provider]  ibuprofen (ADVIL,MOTRIN) 200 MG tablet Take 600 mg by mouth every 6 (six) hours as needed for moderate pain.    [provider]  insulin glargine (LANTUS) 100 UNIT/ML injection Inject 0.65 mLs (65 Units total) into the skin at bedtime. Patient taking differently: Inject 20 Units into the skin at bedtime.  12/24/17   Fawze, Mina A, PA-C  lisinopril (PRINIVIL,ZESTRIL) 10 MG tablet Take 10 mg by mouth every morning. 04/25/18   [provider]  PRODIGY LANCETS 28G MISC Use lancets to check blood glucose daily 12/24/17   Rodell Perna A, PA-C  Semaglutide,0.25 or 0.5MG/DOS, 2 MG/1.5ML SOPN Inject 0.5 mg into the skin every Wednesday. 07/02/18   [provider]  SYNJARDY XR 02-999 MG TB24 Take 1 tablet by mouth 2 (two) times daily.  07/02/18   [provider]    Family History Family History  Problem Relation Age of Onset  . Diabetes Mellitus II Father   . Diabetes Father   . Hypertension Father   . Cancer Mother     Social History Social History   Tobacco Use  . Smoking status: Never Smoker  . Smokeless tobacco: Never Used  Substance Use Topics  . Alcohol use: Yes  Frequency: Never    Comment: occ  . Drug use: No     Allergies   Patient has no known allergies.   Review of Systems Review of Systems  Gastrointestinal: Positive for vomiting.  Neurological: Positive for weakness.  Psychiatric/Behavioral: Positive for confusion.  All other systems reviewed and are negative.    Physical Exam Updated Vital Signs BP 118/72   Pulse 94   Temp (!) 96.6 F (35.9 C) (Rectal)   Resp 18   SpO2 94%   Physical Exam  Constitutional:  Altered, minimally arousable.   HENT:  Head: Normocephalic.  Coffee ground emesis in the mouth   Eyes: Pupils are equal, round, and reactive to  light. Conjunctivae and EOM are normal.  Neck: Normal range of motion. Neck supple.  No meningeal signs   Cardiovascular: Normal rate, regular rhythm and normal heart sounds.  Pulmonary/Chest:  Tachypneic, crackles bilaterally   Abdominal: Soft.  Mild diffuse tenderness, no rebound.   Musculoskeletal: Normal range of motion.  No obvious extremity trauma   Neurological:  Altered, minimally responsive. Moving all extremities   Skin: Skin is warm.  Psychiatric:  Unable   Nursing note and vitals reviewed.    ED Treatments / Results  Labs (all labs ordered are listed, but only abnormal results are displayed) Labs Reviewed  CBC WITH DIFFERENTIAL/PLATELET - Abnormal; Notable for the following components:      Result Value   WBC 28.8 (*)    All other components within normal limits  I-STAT TROPONIN, ED - Abnormal; Notable for the following components:   Troponin i, poc 0.17 (*)    All other components within normal limits  CBG MONITORING, ED - Abnormal; Notable for the following components:   Glucose-Capillary 171 (*)    All other components within normal limits  I-STAT CG4 LACTIC ACID, ED - Abnormal; Notable for the following components:   Lactic Acid, Venous 6.04 (*)    All other components within normal limits  I-STAT CHEM 8, ED - Abnormal; Notable for the following components:   Potassium 3.4 (*)    BUN 24 (*)    Creatinine, Ser 2.10 (*)    Glucose, Bld 183 (*)    TCO2 21 (*)    All other components within normal limits  CULTURE, BLOOD (ROUTINE X 2)  CULTURE, BLOOD (ROUTINE X 2)  COMPREHENSIVE METABOLIC PANEL  LIPASE, BLOOD  ETHANOL  CK  TSH  SALICYLATE LEVEL  ACETAMINOPHEN LEVEL  URINALYSIS, ROUTINE W REFLEX MICROSCOPIC  RAPID URINE DRUG SCREEN, HOSP PERFORMED  POC OCCULT BLOOD, ED  TYPE AND SCREEN    EKG EKG Interpretation  Date/Time:  Saturday August 18 2018 15:01:03 EST Ventricular Rate:  99 PR Interval:    QRS Duration: 90 QT Interval:  339 QTC  Calculation: 435 R Axis:   77 Text Interpretation:  Sinus rhythm Borderline T wave abnormalities No significant change since last tracing Confirmed by Orlie Dakin [phone]) on 08/18/2018 3:06:00 PM   Radiology No results found.  Procedures Procedures (including critical care time)  Angiocath insertion Performed by: Wandra Arthurs  Consent: Verbal consent obtained. Risks and benefits: risks, benefits and alternatives were discussed Time out: Immediately prior to procedure a "time out" was called to verify the correct patient, procedure, equipment, support staff and site/side marked as required.  Preparation: Patient was prepped and draped in the usual sterile fashion.  Vein Location: R antecube  Ultrasound Guided  Gauge: 20 long   Normal blood return and flush without  difficulty Patient tolerance: Patient tolerated the procedure well with no immediate complications.   CRITICAL CARE Performed by: Wandra Arthurs   Total critical care time: 30 minutes  Critical care time was exclusive of separately billable procedures and treating other patients.  Critical care was necessary to treat or prevent imminent or life-threatening deterioration.  Critical care was time spent personally by me on the following activities: development of treatment plan with patient and/or surrogate as well as nursing, discussions with consultants, evaluation of patient's response to treatment, examination of patient, obtaining history from patient or surrogate, ordering and performing treatments and interventions, ordering and review of laboratory studies, ordering and review of radiographic studies, pulse oximetry and re-evaluation of patient's condition.   Medications Ordered in ED Medications  0.9 %  sodium chloride infusion (has no administration in time range)  sodium chloride 0.9 % bolus 1,000 mL (1,000 mLs Intravenous New Bag/Given 08/18/18 1553)  vancomycin (VANCOCIN) IVPB 1000 mg/200 mL premix (has  no administration in time range)  piperacillin-tazobactam (ZOSYN) IVPB 3.375 g (has no administration in time range)     Initial Impression / Assessment and Plan / ED Course  I have reviewed the triage vital signs and the nursing notes.  Pertinent labs & imaging results that were available during my care of the patient were reviewed by me and considered in my medical decision making (see chart for details).    Cory Mullins is a 37 y.o. male here with AMS. Hypothermic on arrival, possible coffee ground in the mouth. I am concerned that he might be septic vs GI bleed vs perforated duodenal ulcer. Will get labs, lactate, cultures, CT ab/pel, CXR, UA.   5:58 PM WBC 30, Lactate 6. CXR showed pneumonia. CT ab/pel showed no intra abdominal injuries, just confirmed pneumonia. CT head unremarkable. Given IVF, vanc/zosyn. Also has acute renal failure. I think he has severe sepsis from pneumonia with lactic acidosis, renal failure.     Final Clinical Impressions(s) / ED Diagnoses   Final diagnoses:  None    ED Discharge Orders    None       Drenda Freeze, MD 08/18/18 1800

## 2018-08-18 NOTE — ED Notes (Signed)
Left note at MD desk requesting update of Pt level of care.

## 2018-08-18 NOTE — ED Notes (Addendum)
Floor asked we ask the provider to increase pt to ICU, not feel appropriate for floor.  Paged provider

## 2018-08-19 ENCOUNTER — Inpatient Hospital Stay (HOSPITAL_COMMUNITY): Payer: BLUE CROSS/BLUE SHIELD

## 2018-08-19 ENCOUNTER — Encounter (HOSPITAL_COMMUNITY): Payer: Self-pay | Admitting: *Deleted

## 2018-08-19 ENCOUNTER — Other Ambulatory Visit: Payer: Self-pay

## 2018-08-19 DIAGNOSIS — F10921 Alcohol use, unspecified with intoxication delirium: Secondary | ICD-10-CM

## 2018-08-19 DIAGNOSIS — R6520 Severe sepsis without septic shock: Secondary | ICD-10-CM

## 2018-08-19 DIAGNOSIS — F10129 Alcohol abuse with intoxication, unspecified: Secondary | ICD-10-CM | POA: Diagnosis present

## 2018-08-19 DIAGNOSIS — Z833 Family history of diabetes mellitus: Secondary | ICD-10-CM

## 2018-08-19 DIAGNOSIS — R680 Hypothermia, not associated with low environmental temperature: Secondary | ICD-10-CM

## 2018-08-19 DIAGNOSIS — B9561 Methicillin susceptible Staphylococcus aureus infection as the cause of diseases classified elsewhere: Secondary | ICD-10-CM

## 2018-08-19 DIAGNOSIS — A419 Sepsis, unspecified organism: Secondary | ICD-10-CM

## 2018-08-19 DIAGNOSIS — H547 Unspecified visual loss: Secondary | ICD-10-CM

## 2018-08-19 DIAGNOSIS — I10 Essential (primary) hypertension: Secondary | ICD-10-CM

## 2018-08-19 DIAGNOSIS — H543 Unqualified visual loss, both eyes: Secondary | ICD-10-CM

## 2018-08-19 DIAGNOSIS — Z9181 History of falling: Secondary | ICD-10-CM

## 2018-08-19 DIAGNOSIS — Z91048 Other nonmedicinal substance allergy status: Secondary | ICD-10-CM

## 2018-08-19 DIAGNOSIS — Z91018 Allergy to other foods: Secondary | ICD-10-CM

## 2018-08-19 DIAGNOSIS — R7881 Bacteremia: Secondary | ICD-10-CM

## 2018-08-19 DIAGNOSIS — Z8249 Family history of ischemic heart disease and other diseases of the circulatory system: Secondary | ICD-10-CM

## 2018-08-19 DIAGNOSIS — J690 Pneumonitis due to inhalation of food and vomit: Secondary | ICD-10-CM | POA: Diagnosis present

## 2018-08-19 DIAGNOSIS — R918 Other nonspecific abnormal finding of lung field: Secondary | ICD-10-CM

## 2018-08-19 DIAGNOSIS — E119 Type 2 diabetes mellitus without complications: Secondary | ICD-10-CM

## 2018-08-19 DIAGNOSIS — F1020 Alcohol dependence, uncomplicated: Secondary | ICD-10-CM

## 2018-08-19 LAB — BLOOD CULTURE ID PANEL (REFLEXED)
ACINETOBACTER BAUMANNII: NOT DETECTED
CANDIDA ALBICANS: NOT DETECTED
CANDIDA PARAPSILOSIS: NOT DETECTED
Candida glabrata: NOT DETECTED
Candida krusei: NOT DETECTED
Candida tropicalis: NOT DETECTED
ENTEROBACTERIACEAE SPECIES: NOT DETECTED
ESCHERICHIA COLI: NOT DETECTED
Enterobacter cloacae complex: NOT DETECTED
Enterococcus species: NOT DETECTED
HAEMOPHILUS INFLUENZAE: NOT DETECTED
KLEBSIELLA OXYTOCA: NOT DETECTED
Klebsiella pneumoniae: NOT DETECTED
Listeria monocytogenes: NOT DETECTED
METHICILLIN RESISTANCE: NOT DETECTED
Neisseria meningitidis: NOT DETECTED
PSEUDOMONAS AERUGINOSA: NOT DETECTED
Proteus species: NOT DETECTED
STREPTOCOCCUS PNEUMONIAE: NOT DETECTED
STREPTOCOCCUS PYOGENES: NOT DETECTED
Serratia marcescens: NOT DETECTED
Staphylococcus aureus (BCID): DETECTED — AB
Staphylococcus species: DETECTED — AB
Streptococcus agalactiae: NOT DETECTED
Streptococcus species: NOT DETECTED

## 2018-08-19 LAB — MRSA PCR SCREENING: MRSA BY PCR: POSITIVE — AB

## 2018-08-19 LAB — BASIC METABOLIC PANEL
Anion gap: 9 (ref 5–15)
BUN: 19 mg/dL (ref 6–20)
CHLORIDE: 111 mmol/L (ref 98–111)
CO2: 21 mmol/L — AB (ref 22–32)
Calcium: 9 mg/dL (ref 8.9–10.3)
Creatinine, Ser: 1.24 mg/dL (ref 0.61–1.24)
GFR calc non Af Amer: >60 mL/min (ref >60)
Glucose, Bld: 160 mg/dL — ABNORMAL HIGH (ref 70–99)
POTASSIUM: 3.4 mmol/L — AB (ref 3.5–5.1)
SODIUM: 141 mmol/L (ref 135–145)

## 2018-08-19 LAB — GLUCOSE, CAPILLARY
GLUCOSE-CAPILLARY: 128 mg/dL — AB (ref 70–99)
GLUCOSE-CAPILLARY: 235 mg/dL — AB (ref 70–99)
Glucose-Capillary: 237 mg/dL — ABNORMAL HIGH (ref 70–99)
Glucose-Capillary: 172 mg/dL — ABNORMAL HIGH (ref 70–99)

## 2018-08-19 LAB — TROPONIN I
Troponin I: 0.12 ng/mL (ref <0.03)
Troponin I: 0.09 ng/mL (ref <0.03)

## 2018-08-19 LAB — MAGNESIUM: Magnesium: 2.1 mg/dL (ref 1.7–2.4)

## 2018-08-19 LAB — HEMOGLOBIN AND HEMATOCRIT, BLOOD
HCT: 38.3 % — ABNORMAL LOW (ref 39.0–52.0)
HCT: 35.8 % — ABNORMAL LOW (ref 39.0–52.0)
HEMOGLOBIN: 10.9 g/dL — AB (ref 13.0–17.0)
Hemoglobin: 11.1 g/dL — ABNORMAL LOW (ref 13.0–17.0)

## 2018-08-19 LAB — LACTIC ACID, PLASMA
LACTIC ACID, VENOUS: 1.6 mmol/L (ref 0.5–1.9)
Lactic Acid, Venous: 1.5 mmol/L (ref 0.5–1.9)

## 2018-08-19 MED ORDER — ADULT MULTIVITAMIN W/MINERALS CH
1.0000 | ORAL_TABLET | Freq: Every day | ORAL | Status: DC
  Administered 2018-08-19 – 2018-08-22 (×4): 1 via ORAL
  Filled 2018-08-19 (×4): qty 1

## 2018-08-19 MED ORDER — SODIUM CHLORIDE 0.9 % IV SOLN
3.0000 g | Freq: Four times a day (QID) | INTRAVENOUS | Status: DC
  Administered 2018-08-19 – 2018-08-22 (×12): 3 g via INTRAVENOUS
  Filled 2018-08-19 (×16): qty 3

## 2018-08-19 MED ORDER — METOPROLOL TARTRATE 50 MG PO TABS
50.0000 mg | ORAL_TABLET | Freq: Two times a day (BID) | ORAL | Status: DC
  Administered 2018-08-19 – 2018-08-22 (×4): 50 mg via ORAL
  Filled 2018-08-19 (×7): qty 1

## 2018-08-19 MED ORDER — CHLORHEXIDINE GLUCONATE CLOTH 2 % EX PADS
6.0000 | MEDICATED_PAD | Freq: Every day | CUTANEOUS | Status: DC
  Administered 2018-08-20 – 2018-08-22 (×3): 6 via TOPICAL

## 2018-08-19 MED ORDER — BISACODYL 10 MG RE SUPP
10.0000 mg | Freq: Every day | RECTAL | Status: DC
  Administered 2018-08-19 – 2018-08-21 (×2): 10 mg via RECTAL
  Filled 2018-08-19 (×4): qty 1

## 2018-08-19 MED ORDER — FAMOTIDINE 20 MG PO TABS
40.0000 mg | ORAL_TABLET | Freq: Two times a day (BID) | ORAL | Status: DC
  Administered 2018-08-19 – 2018-08-22 (×7): 40 mg via ORAL
  Filled 2018-08-19 (×7): qty 2

## 2018-08-19 MED ORDER — SODIUM CHLORIDE 0.9 % IV SOLN: INTRAVENOUS | Status: DC

## 2018-08-19 MED ORDER — CHLORDIAZEPOXIDE HCL 5 MG PO CAPS
15.0000 mg | ORAL_CAPSULE | Freq: Three times a day (TID) | ORAL | Status: DC
  Administered 2018-08-19 – 2018-08-22 (×10): 15 mg via ORAL
  Filled 2018-08-19 (×11): qty 3

## 2018-08-19 MED ORDER — MUPIROCIN 2 % EX OINT
1.0000 "application " | TOPICAL_OINTMENT | Freq: Two times a day (BID) | CUTANEOUS | Status: DC
  Administered 2018-08-19 – 2018-08-22 (×7): 1 via NASAL
  Filled 2018-08-19 (×5): qty 22

## 2018-08-19 MED ORDER — LORAZEPAM 2 MG/ML IJ SOLN
0.0000 mg | Freq: Four times a day (QID) | INTRAMUSCULAR | Status: AC
  Administered 2018-08-19: 1 mg via INTRAVENOUS
  Filled 2018-08-19: qty 1

## 2018-08-19 MED ORDER — IPRATROPIUM-ALBUTEROL 0.5-2.5 (3) MG/3ML IN SOLN: 3.0000 mL | RESPIRATORY_TRACT | Status: DC | PRN

## 2018-08-19 MED ORDER — INSULIN GLARGINE 100 UNIT/ML ~~LOC~~ SOLN
10.0000 [IU] | Freq: Every day | SUBCUTANEOUS | Status: DC
  Administered 2018-08-19 – 2018-08-20 (×2): 10 [IU] via SUBCUTANEOUS
  Filled 2018-08-19 (×2): qty 0.1

## 2018-08-19 MED ORDER — THIAMINE HCL 100 MG/ML IJ SOLN
100.0000 mg | Freq: Every day | INTRAMUSCULAR | Status: DC
  Filled 2018-08-19 (×3): qty 2

## 2018-08-19 MED ORDER — ATORVASTATIN CALCIUM 10 MG PO TABS
10.0000 mg | ORAL_TABLET | Freq: Every day | ORAL | Status: DC
  Administered 2018-08-19 – 2018-08-22 (×4): 10 mg via ORAL
  Filled 2018-08-19 (×4): qty 1

## 2018-08-19 MED ORDER — PNEUMOCOCCAL VAC POLYVALENT 25 MCG/0.5ML IJ INJ
0.5000 mL | INJECTION | INTRAMUSCULAR | Status: DC
  Filled 2018-08-19 (×2): qty 0.5

## 2018-08-19 MED ORDER — PERFLUTREN LIPID MICROSPHERE
1.0000 mL | INTRAVENOUS | Status: DC | PRN
  Filled 2018-08-19: qty 10

## 2018-08-19 MED ORDER — LORAZEPAM 2 MG/ML IJ SOLN: 1.0000 mg | Freq: Four times a day (QID) | INTRAMUSCULAR | Status: AC | PRN

## 2018-08-19 MED ORDER — VITAMIN B-1 100 MG PO TABS
100.0000 mg | ORAL_TABLET | Freq: Every day | ORAL | Status: DC
  Administered 2018-08-19 – 2018-08-22 (×4): 100 mg via ORAL
  Filled 2018-08-19 (×4): qty 1

## 2018-08-19 MED ORDER — LORAZEPAM 2 MG/ML IJ SOLN: 0.0000 mg | Freq: Two times a day (BID) | INTRAMUSCULAR | Status: DC

## 2018-08-19 MED ORDER — FOLIC ACID 1 MG PO TABS
1.0000 mg | ORAL_TABLET | Freq: Every day | ORAL | Status: DC
  Administered 2018-08-19 – 2018-08-22 (×4): 1 mg via ORAL
  Filled 2018-08-19 (×4): qty 1

## 2018-08-19 MED ORDER — DOCUSATE SODIUM 100 MG PO CAPS
200.0000 mg | ORAL_CAPSULE | Freq: Two times a day (BID) | ORAL | Status: DC
  Administered 2018-08-19 – 2018-08-22 (×6): 200 mg via ORAL
  Filled 2018-08-19 (×7): qty 2

## 2018-08-19 MED ORDER — KCL-LACTATED RINGERS-D5W 20 MEQ/L IV SOLN
INTRAVENOUS | Status: DC
  Administered 2018-08-19 – 2018-08-20 (×2): via INTRAVENOUS
  Filled 2018-08-19 (×3): qty 1000

## 2018-08-19 MED ORDER — LORAZEPAM 1 MG PO TABS: 1.0000 mg | ORAL_TABLET | Freq: Four times a day (QID) | ORAL | Status: AC | PRN

## 2018-08-19 MED ORDER — IPRATROPIUM-ALBUTEROL 0.5-2.5 (3) MG/3ML IN SOLN
3.0000 mL | Freq: Three times a day (TID) | RESPIRATORY_TRACT | Status: DC
  Administered 2018-08-19: 3 mL via RESPIRATORY_TRACT
  Filled 2018-08-19: qty 3

## 2018-08-19 MED ORDER — PROMETHAZINE HCL 25 MG/ML IJ SOLN: 25.0000 mg | Freq: Four times a day (QID) | INTRAMUSCULAR | Status: DC | PRN

## 2018-08-19 NOTE — Progress Notes (Signed)
CRITICAL VALUE ALERT  Critical Value:  MRSA PCR +  Date & Time Notied:  08/19/18 0524  Provider Notified: n/a  Orders Received/Actions taken: Standing orders placed in [facility] and carried out as ordered.

## 2018-08-19 NOTE — Consult Note (Signed)
[facility]       Reason for Consult: MSSA bacteremia    Referring Physician: CHAMP autoconsult  Active Problems:   Blindness   Sepsis (HCC)   Aspiration pneumonia (HCC)   Alcohol abuse with intoxication (HCC)   . atorvastatin  10 mg Oral Daily  . chlordiazePOXIDE  15 mg Oral TID  . [START ON 08/20/2018] Chlorhexidine Gluconate Cloth  6 each Topical Q0600  . famotidine  40 mg Oral BID  . folic acid  1 mg Oral Daily  . insulin aspart  0-5 Units Subcutaneous QHS  . insulin aspart  0-9 Units Subcutaneous TID WC  . insulin glargine  10 Units Subcutaneous Daily  . ipratropium-albuterol  3 mL Nebulization TID  . LORazepam  0-4 mg Intravenous Q6H   Followed by  . [START ON 08/21/2018] LORazepam  0-4 mg Intravenous Q12H  . multivitamin with minerals  1 tablet Oral Daily  . mupirocin ointment  1 application Nasal BID  . pantoprazole (PROTONIX) IV  40 mg Intravenous Q12H  . [START ON 08/20/2018] pneumococcal 23 valent vaccine  0.5 mL Intramuscular Tomorrow-1000  . thiamine  100 mg Oral Daily   Or  . thiamine  100 mg Intravenous Daily    Recommendations: Continue with amp/sulbactam  TTE - already ordered  Assessment: He has 1 bottle positive for MSSA of unknown significance.  History more c/w aspiration pneumonia.  Will continue to monitor.   Antibiotics: Amp/sulbactam  HPI: Cory Mullins is a 37 y.o. male with history of HTN, blindness, DM presented with AMS.  Is an alcoholic and has been falling more recently.  Noted to have coffee-ground emesis.  Hypothermic.  CXR with opacity of lower right lobe.  Some concerns for sepsis initially, elevated lactic acid.  Blood cultures now with 1 aerobic bottle positive for MSSA.  He feels better.  Main complaint has been cough and breathing.     Review of Systems:  Constitutional: negative for fevers and chills Respiratory: negative for cough or sputum Gastrointestinal: negative for nausea and  diarrhea All other systems reviewed and are negative    Past Medical History:  Diagnosis Date  . Blind   . Diabetes mellitus without complication (HCC)   . Hypertension     Social History   Tobacco Use  . Smoking status: Never Smoker  . Smokeless tobacco: Never Used  Substance Use Topics  . Alcohol use: Yes    Frequency: Never    Comment: occ  . Drug use: No    Family History  Problem Relation Age of Onset  . Diabetes Mellitus II Father   . Diabetes Father   . Hypertension Father   . Cancer Mother     Allergies  Allergen Reactions  . Adhesive [Tape] Other (See Comments)    Has eczema and his skin is sensitive, per mom  . Fruit & Vegetable Daily [Nutritional Supplements] Hives and Other (See Comments)    Pears    Physical Exam: Constitutional: in no apparent distress and alert  Vitals:   08/19/18 0750 08/19/18 1113  BP:  (!) 112/94  Pulse:  100  Resp:    Temp:    SpO2: 97%    EYES: anicteric ENMT: no thrush Cardiovascular: Cor RRR Respiratory: CTA B; normal respiratory effort GI: soft, nt Musculoskeletal: peripheral pulses normal, no pedal edema, no clubbing or cyanosis Skin: negatives: no rash Neuro: non-focal  Lab Results  Component Value Date   WBC 23.0 (H) 08/19/2018  HGB 11.1 (L) 08/19/2018   HCT 38.3 (L) 08/19/2018   MCV 87.0 08/19/2018   PLT 252 08/19/2018    Lab Results  Component Value Date   CREATININE 1.24 08/19/2018   BUN 19 08/19/2018   NA 141 08/19/2018   K 3.4 (L) 08/19/2018   CL 111 08/19/2018   CO2 21 (L) 08/19/2018    Lab Results  Component Value Date   ALT 16 08/18/2018   AST 25 08/18/2018   ALKPHOS 58 08/18/2018     Microbiology: Recent Results (from the past 240 hour(s))  Blood culture (routine x 2)     Status: None (Preliminary result)   Collection Time: 08/18/18  3:27 PM  Result Value Ref Range Status   Specimen Description BLOOD BLOOD LEFT HAND  Final   Special Requests   Final    BOTTLES DRAWN AEROBIC  ONLY Blood Culture adequate volume   Culture   Final    NO GROWTH < 24 HOURS Performed at [facility], [address] [place], [place] [address]    Report Status PENDING  Incomplete  Blood culture (routine x 2)     Status: None (Preliminary result)   Collection Time: 08/18/18  3:32 PM  Result Value Ref Range Status   Specimen Description BLOOD RIGHT ANTECUBITAL  Final   Special Requests   Final    BOTTLES DRAWN AEROBIC AND ANAEROBIC Blood Culture adequate volume   Culture  Setup Time   Final    GRAM POSITIVE COCCI ANAEROBIC BOTTLE ONLY Organism ID to follow CRITICAL RESULT CALLED TO, READ BACK BY AND VERIFIED WITH: PHARMD RACHEL R [id number]  [id number] FCP Performed at [facility], [address] [place], [place] [address]    Culture GRAM POSITIVE COCCI  Final   Report Status PENDING  Incomplete  Blood Culture ID Panel (Reflexed)     Status: Abnormal   Collection Time: 08/18/18  3:32 PM  Result Value Ref Range Status   Enterococcus species NOT DETECTED NOT DETECTED Final   Listeria monocytogenes NOT DETECTED NOT DETECTED Final   Staphylococcus species DETECTED (A) NOT DETECTED Final    Comment: CRITICAL RESULT CALLED TO, READ BACK BY AND VERIFIED WITH: PHARMD RACHEL R 1106  [id number] FCP    Staphylococcus aureus (BCID) DETECTED (A) NOT DETECTED Final    Comment: Methicillin (oxacillin) susceptible Staphylococcus aureus (MSSA). Preferred therapy is anti staphylococcal beta lactam antibiotic (Cefazolin or Nafcillin), unless clinically contraindicated. CRITICAL RESULT CALLED TO, READ BACK BY AND VERIFIED WITH: PHARMD RACHEL R 1106  [id number] FCP    Methicillin resistance NOT DETECTED NOT DETECTED Final   Streptococcus species NOT DETECTED NOT DETECTED Final   Streptococcus agalactiae NOT DETECTED NOT DETECTED Final   Streptococcus pneumoniae NOT DETECTED NOT DETECTED Final   Streptococcus pyogenes NOT DETECTED NOT DETECTED Final   Acinetobacter baumannii NOT DETECTED NOT  DETECTED Final   Enterobacteriaceae species NOT DETECTED NOT DETECTED Final   Enterobacter cloacae complex NOT DETECTED NOT DETECTED Final   Escherichia coli NOT DETECTED NOT DETECTED Final   Klebsiella oxytoca NOT DETECTED NOT DETECTED Final   Klebsiella pneumoniae NOT DETECTED NOT DETECTED Final   Proteus species NOT DETECTED NOT DETECTED Final   Serratia marcescens NOT DETECTED NOT DETECTED Final   Haemophilus influenzae NOT DETECTED NOT DETECTED Final   Neisseria meningitidis NOT DETECTED NOT DETECTED Final   Pseudomonas aeruginosa NOT DETECTED NOT DETECTED Final   Candida albicans NOT DETECTED NOT DETECTED Final   Candida glabrata NOT  DETECTED NOT DETECTED Final   Candida krusei NOT DETECTED NOT DETECTED Final   Candida parapsilosis NOT DETECTED NOT DETECTED Final   Candida tropicalis NOT DETECTED NOT DETECTED Final    Comment: Performed at [facility], [address] [place], [place] [address]  MRSA PCR Screening     Status: Abnormal   Collection Time: 08/19/18 12:41 AM  Result Value Ref Range Status   MRSA by PCR POSITIVE (A) NEGATIVE Final    Comment: RESULT CALLED TO, READ BACK BY AND VERIFIED WITH: RN T. Davene Costain [id number] @0524  THANEY        The GeneXpert MRSA Assay (FDA approved for NASAL specimens only), is one component of a comprehensive MRSA colonization surveillance program. It is not intended to diagnose MRSA infection nor to guide or monitor treatment for MRSA infections. Performed at [facility], [address], [place], [place] [address]     Gardiner Barefoot, MD [facility] Infectious [facility] [email or web address] [phone] pager  [phone] cell 08/19/2018, 2:12 PM

## 2018-08-19 NOTE — Progress Notes (Addendum)
PROGRESS NOTE                                                                                                                                                                                                             Patient Demographics:    Cory Mullins, is a 37 y.o. male, DOB - 1980-11-09, UJW:[id number]  Admit date - 08/18/2018   Admitting Physician Carron Curie, MD  Outpatient Primary MD for the patient is Barbarann Ehlers  LOS - 1  Chief Complaint  Patient presents with  . GI Bleeding       Brief Narrative  Cory Mullins  is a 37 y.o. male, with past medical history significant for blindness, hypertension and diabetes mellitus who presents today with altered mental status .  As per his roommate that the patient has been falling a lot in the last few days and has been drinking more alcohol than usual.  He was found today on the floor with coffee-ground emesis, unresponsive.  His blood sugar on arrival was 172 and his pulse ox was 86% on room air.  The patient was noted to be hypothermic.  In the emergency room CT of the head was negative but his chest x-ray showed right lower lobe pneumonia and his white blood cell count was elevated.  His troponins were slightly elevated and he was in acute renal failure.  Patient is admitted for aspiration pneumonia with sepsis.    Subjective:    Cory Mullins today has, No headache, No chest pain, No abdominal pain - +ve Nausea, No new weakness tingling or numbness, No Cough - SOB.     Assessment  & Plan :     1.  Sepsis due to aspiration pneumonia.  Sepsis physiology seems to have resolved, continue gentle hydration, lactate has normalized, appears nontoxic, continue broad-spectrum IV antibiotics initiated upon admission, follow cultures.  Oxygen and nebulizer treatments as needed.  2.  Nausea and dark emesis caused after alcohol binge.  Could have had mild Mallory-Weiss tear, currently on clear liquids, supportive care  with IV Phenergan and Zofran, check KUB, continue IV PPI and oral Pepcid, monitor H&H q 8 hours, type screen and hold.  3.  Alcohol abuse.  Counseled to quit.  At risk for DTs, placed on Librium and CIWA protocol.  4.  Legal blindness.  Supportive care.  5.  Hypokalemia.  Replace and monitor.    6.  Mild troponin elevation and non-ACS pattern.  Trend is flat, EKG  is nonacute with nonspecific changes, cannot use aspirin due to possible bloody emesis as in #2 above, no beta-blocker currently due to borderline low blood pressures, check echocardiogram to evaluate EF and wall motion.  7. DM type II.  Low-dose Lantus, gentle sliding scale, on low-dose D5 drip.  Will monitor closely.  Lab Results  Component Value Date   HGBA1C 11.5 (H) 12/29/2017   CBG (last 3)  Recent Labs    08/18/18 1524 08/18/18 2214 08/19/18 0033  GLUCAP 171* 158* 128*     Family Communication  :   None  Code Status :  Full  Disposition Plan  :  Stepdown  Consults  :  None  Procedures  :    TTE   DVT Prophylaxis  :    SCDs    Lab Results  Component Value Date   PLT 252 08/19/2018    Diet :  Diet Order            Diet clear liquid Room service appropriate? Yes; Fluid consistency: Thin  Diet effective now               Inpatient Medications Scheduled Meds: . chlordiazePOXIDE  15 mg Oral TID  . [START ON 08/20/2018] Chlorhexidine Gluconate Cloth  6 each Topical Q0600  . famotidine  40 mg Oral BID  . folic acid  1 mg Oral Daily  . insulin aspart  0-5 Units Subcutaneous QHS  . insulin aspart  0-9 Units Subcutaneous TID WC  . ipratropium-albuterol  3 mL Nebulization Q6H  . LORazepam  0-4 mg Intravenous Q6H   Followed by  . [START ON 08/21/2018] LORazepam  0-4 mg Intravenous Q12H  . multivitamin with minerals  1 tablet Oral Daily  . mupirocin ointment  1 application Nasal BID  . pantoprazole (PROTONIX) IV  40 mg Intravenous Q12H  . [START ON 08/20/2018] pneumococcal 23 valent vaccine   0.5 mL Intramuscular Tomorrow-1000  . thiamine  100 mg Oral Daily   Or  . thiamine  100 mg Intravenous Daily   Continuous Infusions: . dextrose 5% lactated ringers with KCl 20 mEq/L    . piperacillin-tazobactam (ZOSYN)  IV 3.375 g (08/19/18 0500)  . vancomycin     PRN Meds:.LORazepam **OR** LORazepam, ondansetron **OR** ondansetron (ZOFRAN) IV, promethazine  Antibiotics  :   Anti-infectives (From admission, onward)   Start     Dose/Rate Route Frequency Ordered Stop   08/19/18 1830  vancomycin (VANCOCIN) 1,250 mg in sodium chloride 0.9 % 250 mL IVPB     1,250 mg 166.7 mL/hr over 90 Minutes Intravenous Every 24 hours 08/18/18 1813     08/18/18 2200  piperacillin-tazobactam (ZOSYN) IVPB 3.375 g     3.375 g 12.5 mL/hr over 240 Minutes Intravenous Every 8 hours 08/18/18 1813     08/18/18 1815  vancomycin (VANCOCIN) IVPB 1000 mg/200 mL premix     1,000 mg 200 mL/hr over 60 Minutes Intravenous  Once 08/18/18 1813 08/18/18 1925   08/18/18 1600  vancomycin (VANCOCIN) IVPB 1000 mg/200 mL premix     1,000 mg 200 mL/hr over 60 Minutes Intravenous  Once 08/18/18 1547 08/18/18 1803   08/18/18 1600  piperacillin-tazobactam (ZOSYN) IVPB 3.375 g     3.375 g 100 mL/hr over 30 Minutes Intravenous  Once 08/18/18 1547 08/18/18 1645          Objective:   Vitals:   08/18/18 2345 08/19/18 0139 08/19/18 0352 08/19/18 0750  BP: (!) 117/59     Pulse: Marland Kitchen)  107 97    Resp: 15 20    Temp:   99.6 F (37.6 C)   TempSrc:   Oral   SpO2: 92% 93%  97%  Weight:        Wt Readings from Last 3 Encounters:  08/18/18 104.3 kg  07/26/18 104.3 kg  07/17/18 108.9 kg     Intake/Output Summary (Last 24 hours) at 08/19/2018 1015 Last data filed at 08/19/2018 0500 Gross per 24 hour  Intake 2050 ml  Output -  Net 2050 ml     Physical Exam  Awake Alert, No new F.N deficits, Normal affect [place].AT,PERRAL Supple Neck,No JVD, No cervical lymphadenopathy appriciated.  Symmetrical Chest wall movement,  Good air movement bilaterally, Coarse breath sounds RRR,No Gallops,Rubs or new Murmurs, No Parasternal Heave +ve B.Sounds, Abd Soft, No tenderness, No organomegaly appriciated, No rebound - guarding or rigidity. No Cyanosis, Clubbing or edema, No new Rash or bruise       Data Review:    CBC Recent Labs  Lab 08/18/18 1531 08/18/18 1540 08/19/18 0612  WBC 28.8*  --  23.0*  HGB 14.3 16.0 12.5*  HCT 47.3 47.0 40.0  PLT 262  --  252  MCV 91.0  --  87.0  MCH 27.5  --  27.2  MCHC 30.2  --  31.3  RDW 12.2  --  12.5  LYMPHSABS 0.9  --   --   MONOABS 2.1*  --   --   EOSABS 0.0  --   --   BASOSABS 0.1  --   --     Chemistries  Recent Labs  Lab 08/18/18 1531 08/18/18 1540 08/19/18 0612  NA 137 141 141  K 3.3* 3.4* 3.4*  CL 103 104 111  CO2 18*  --  21*  GLUCOSE 185* 183* 160*  BUN 22* 24* 19  CREATININE 1.95* 2.10* 1.24  CALCIUM 9.4  --  9.0  MG  --   --  2.1  AST 25  --   --   ALT 16  --   --   ALKPHOS 58  --   --   BILITOT 0.4  --   --    ------------------------------------------------------------------------------------------------------------------ No results for input(s): CHOL, HDL, LDLCALC, TRIG, CHOLHDL, LDLDIRECT in the last 72 hours.  Lab Results  Component Value Date   HGBA1C 11.5 (H) 12/29/2017   ------------------------------------------------------------------------------------------------------------------ Recent Labs    08/18/18 1531  TSH 1.222   ------------------------------------------------------------------------------------------------------------------ No results for input(s): VITAMINB12, FOLATE, FERRITIN, TIBC, IRON, RETICCTPCT in the last 72 hours.  Coagulation profile No results for input(s): INR, PROTIME in the last 168 hours.  No results for input(s): DDIMER in the last 72 hours.  Cardiac Enzymes Recent Labs  Lab 08/18/18 2014 08/19/18 0055 08/19/18 0612  TROPONINI 0.13* 0.12* 0.09*    ------------------------------------------------------------------------------------------------------------------ No results found for: BNP  Micro Results Recent Results (from the past 240 hour(s))  Blood culture (routine x 2)     Status: None (Preliminary result)   Collection Time: 08/18/18  3:32 PM  Result Value Ref Range Status   Specimen Description BLOOD RIGHT ANTECUBITAL  Final   Special Requests   Final    BOTTLES DRAWN AEROBIC AND ANAEROBIC Blood Culture adequate volume   Culture  Setup Time   Final    GRAM POSITIVE COCCI ANAEROBIC BOTTLE ONLY Organism ID to follow Performed at [facility], [address], [place], [place] [address]    Culture PENDING  Incomplete   Report Status  PENDING  Incomplete  MRSA PCR Screening     Status: Abnormal   Collection Time: 08/19/18 12:41 AM  Result Value Ref Range Status   MRSA by PCR POSITIVE (A) NEGATIVE Final    Comment: RESULT CALLED TO, READ BACK BY AND VERIFIED WITH: RN T. Davene Costain [id number] @0524  THANEY        The GeneXpert MRSA Assay (FDA approved for NASAL specimens only), is one component of a comprehensive MRSA colonization surveillance program. It is not intended to diagnose MRSA infection nor to guide or monitor treatment for MRSA infections. Performed at [facility], [address], [place], [place] [address]     Radiology Reports Ct Head Wo Contrast  Result Date: 08/18/2018 CLINICAL DATA:  Altered level of consciousness. EXAM: CT HEAD WITHOUT CONTRAST TECHNIQUE: Contiguous axial images were obtained from the base of the skull through the vertex without intravenous contrast. COMPARISON:  None. FINDINGS: Brain: No evidence of acute infarction, hemorrhage, hydrocephalus, extra-axial collection or mass lesion/mass effect. Vascular: No hyperdense vessel or unexpected calcification. Skull: Normal. Negative for fracture or focal lesion. Sinuses/Orbits: No acute finding. Other: None. IMPRESSION: Normal head CT.  Electronically Signed   By: Lupita Raider, M.D.   On: 08/18/2018 16:52   Dg Chest Port 1 View  Result Date: 08/18/2018 CLINICAL DATA:  Hypoxia. EXAM: PORTABLE CHEST 1 VIEW COMPARISON:  Radiographs of July 17, 2018. FINDINGS: The heart size and mediastinal contours are within normal limits. No pneumothorax or pleural effusion is noted. Left lung is clear. New right midlung and basilar opacity is noted concerning for possible pneumonia. The visualized skeletal structures are unremarkable. IMPRESSION: New right lung opacity is noted concerning for pneumonia. Electronically Signed   By: Lupita Raider, M.D.   On: 08/18/2018 16:23   Dg Shoulder Right Port  Result Date: 08/18/2018 CLINICAL DATA:  Right shoulder pain.  Unresponsive patient. EXAM: PORTABLE RIGHT SHOULDER COMPARISON:  None. FINDINGS: Single AP view of the shoulder demonstrates no grossly displaced fracture. Visualized right hemithorax is unremarkable. IMPRESSION: No grossly displaced fracture of the right shoulder. Recommend dedicated views of the shoulder when patient clinically able to exclude the possibility of a subtle fracture or dislocation. Electronically Signed   By: Annia Belt M.D.   On: 08/18/2018 18:01   Ct Renal Stone Study  Result Date: 08/18/2018 CLINICAL DATA:  Flank pain, vomiting, acute renal failure. Coffee ground emesis since this morning. EXAM: CT ABDOMEN AND PELVIS WITHOUT CONTRAST TECHNIQUE: Multidetector CT imaging of the abdomen and pelvis was performed following the standard protocol without IV contrast. COMPARISON:  None. FINDINGS: Lower chest: Patchy dense consolidations within the RIGHT lower lobe, incompletely imaged. Hepatobiliary: Streak artifact limits characterization of the liver and gallbladder. However, there is no focal mass or lesion identified within the liver and no secondary signs of acute cholecystitis. No evidence of bile duct dilatation. Pancreas: Unremarkable. No pancreatic ductal dilatation or  surrounding inflammatory changes. Spleen: Normal in size without focal abnormality. Adrenals/Urinary Tract: Adrenal glands appear normal. Kidneys are unremarkable without mass, stone or hydronephrosis. No ureteral or bladder calculi identified. Bladder appears normal. Stomach/Bowel: No dilated large or small bowel loops. No evidence of bowel wall inflammation. Appendix is normal. Stomach is unremarkable. Vascular/Lymphatic: No significant vascular findings are present. No enlarged abdominal or pelvic lymph nodes. Reproductive: Prostate is unremarkable. Other: No free fluid or abscess collection. No free intraperitoneal air. Musculoskeletal: No acute or suspicious osseous finding. IMPRESSION: 1. Patchy dense consolidations within the RIGHT lower lung,  incompletely imaged, compatible with pneumonia or aspiration. 2. No acute findings within the abdomen or pelvis, with mild study limitations detailed above. No bowel obstruction or evidence of bowel wall inflammation seen. No evidence of acute solid organ abnormality. No renal or ureteral calculi. No free fluid or abscess. Appendix is normal. Electronically Signed   By: Bary Richard M.D.   On: 08/18/2018 17:37    Time Spent in minutes  30   Susa Raring M.D on 08/19/2018 at 10:15 AM   To page go to [email or web address] - password [email or web address]

## 2018-08-19 NOTE — Progress Notes (Addendum)
PHARMACY - PHYSICIAN COMMUNICATION CRITICAL VALUE ALERT - BLOOD CULTURE IDENTIFICATION (BCID)  Meril Dray is an 37 y.o. male who presented to [facility] on 08/18/2018 with a chief complaint of GIB  Name of physician (or Provider) Contacted: Thedore Mins  Current antibiotics: vancomycin + zosyn  Changes to prescribed antibiotics recommended:  Continue current antibiotics  Addendum: MD believes this to be a contaminant, will de-escalate antibiotics to unasyn  Results for orders placed or performed during the hospital encounter of 08/18/18  Blood Culture ID Panel (Reflexed) (Collected: 08/18/2018  3:32 PM)  Result Value Ref Range   Enterococcus species NOT DETECTED NOT DETECTED   Listeria monocytogenes NOT DETECTED NOT DETECTED   Staphylococcus species DETECTED (A) NOT DETECTED   Staphylococcus aureus (BCID) DETECTED (A) NOT DETECTED   Methicillin resistance NOT DETECTED NOT DETECTED   Streptococcus species NOT DETECTED NOT DETECTED   Streptococcus agalactiae NOT DETECTED NOT DETECTED   Streptococcus pneumoniae NOT DETECTED NOT DETECTED   Streptococcus pyogenes NOT DETECTED NOT DETECTED   Acinetobacter baumannii NOT DETECTED NOT DETECTED   Enterobacteriaceae species NOT DETECTED NOT DETECTED   Enterobacter cloacae complex NOT DETECTED NOT DETECTED   Escherichia coli NOT DETECTED NOT DETECTED   Klebsiella oxytoca NOT DETECTED NOT DETECTED   Klebsiella pneumoniae NOT DETECTED NOT DETECTED   Proteus species NOT DETECTED NOT DETECTED   Serratia marcescens NOT DETECTED NOT DETECTED   Haemophilus influenzae NOT DETECTED NOT DETECTED   Neisseria meningitidis NOT DETECTED NOT DETECTED   Pseudomonas aeruginosa NOT DETECTED NOT DETECTED   Candida albicans NOT DETECTED NOT DETECTED   Candida glabrata NOT DETECTED NOT DETECTED   Candida krusei NOT DETECTED NOT DETECTED   Candida parapsilosis NOT DETECTED NOT DETECTED   Candida tropicalis NOT DETECTED NOT DETECTED    Shirly Bartosiewicz, Drake Leach 08/19/2018  11:13 AM

## 2018-08-20 ENCOUNTER — Inpatient Hospital Stay (HOSPITAL_COMMUNITY): Payer: BLUE CROSS/BLUE SHIELD

## 2018-08-20 DIAGNOSIS — D72829 Elevated white blood cell count, unspecified: Secondary | ICD-10-CM

## 2018-08-20 DIAGNOSIS — R112 Nausea with vomiting, unspecified: Secondary | ICD-10-CM

## 2018-08-20 DIAGNOSIS — I10 Essential (primary) hypertension: Secondary | ICD-10-CM

## 2018-08-20 DIAGNOSIS — F10129 Alcohol abuse with intoxication, unspecified: Secondary | ICD-10-CM

## 2018-08-20 DIAGNOSIS — J690 Pneumonitis due to inhalation of food and vomit: Secondary | ICD-10-CM

## 2018-08-20 LAB — CBC
HEMATOCRIT: 40 % (ref 39.0–52.0)
HEMATOCRIT: 35.8 % — AB (ref 39.0–52.0)
HEMOGLOBIN: 12.5 g/dL — AB (ref 13.0–17.0)
Hemoglobin: 11 g/dL — ABNORMAL LOW (ref 13.0–17.0)
MCH: 27.2 pg (ref 26.0–34.0)
MCH: 27 pg (ref 26.0–34.0)
MCHC: 31.3 g/dL (ref 30.0–36.0)
MCHC: 30.7 g/dL (ref 30.0–36.0)
MCV: 87 fL (ref 80.0–100.0)
MCV: 87.7 fL (ref 80.0–100.0)
NRBC: 0 % (ref 0.0–0.2)
PLATELETS: 235 10*3/uL (ref 150–400)
Platelets: 252 10*3/uL (ref 150–400)
RBC: 4.6 MIL/uL (ref 4.22–5.81)
RBC: 4.08 MIL/uL — ABNORMAL LOW (ref 4.22–5.81)
RDW: 12.5 % (ref 11.5–15.5)
RDW: 12.5 % (ref 11.5–15.5)
WBC: 23 10*3/uL — ABNORMAL HIGH (ref 4.0–10.5)
WBC: 14.5 10*3/uL — AB (ref 4.0–10.5)
nRBC: 0 % (ref 0.0–0.2)

## 2018-08-20 LAB — BASIC METABOLIC PANEL
ANION GAP: 5 (ref 5–15)
BUN: 10 mg/dL (ref 6–20)
CALCIUM: 9.4 mg/dL (ref 8.9–10.3)
CO2: 24 mmol/L (ref 22–32)
CREATININE: 0.94 mg/dL (ref 0.61–1.24)
Chloride: 111 mmol/L (ref 98–111)
Glucose, Bld: 179 mg/dL — ABNORMAL HIGH (ref 70–99)
Potassium: 3.4 mmol/L — ABNORMAL LOW (ref 3.5–5.1)
SODIUM: 140 mmol/L (ref 135–145)

## 2018-08-20 LAB — GLUCOSE, CAPILLARY
GLUCOSE-CAPILLARY: 178 mg/dL — AB (ref 70–99)
GLUCOSE-CAPILLARY: 171 mg/dL — AB (ref 70–99)
GLUCOSE-CAPILLARY: 136 mg/dL — AB (ref 70–99)
Glucose-Capillary: 124 mg/dL — ABNORMAL HIGH (ref 70–99)
Glucose-Capillary: 129 mg/dL — ABNORMAL HIGH (ref 70–99)

## 2018-08-20 LAB — ECHOCARDIOGRAM COMPLETE: Weight: 3679.99 oz

## 2018-08-20 LAB — MAGNESIUM: MAGNESIUM: 2.1 mg/dL (ref 1.7–2.4)

## 2018-08-20 MED ORDER — LACTATED RINGERS IV SOLN
INTRAVENOUS | Status: AC
  Administered 2018-08-20: 12:00:00 via INTRAVENOUS

## 2018-08-20 MED ORDER — POTASSIUM CHLORIDE CRYS ER 20 MEQ PO TBCR
40.0000 meq | EXTENDED_RELEASE_TABLET | Freq: Once | ORAL | Status: AC
  Administered 2018-08-20: 40 meq via ORAL
  Filled 2018-08-20: qty 2

## 2018-08-20 MED ORDER — ACETAMINOPHEN 325 MG PO TABS
650.0000 mg | ORAL_TABLET | Freq: Four times a day (QID) | ORAL | Status: DC | PRN
  Administered 2018-08-20 – 2018-08-21 (×2): 650 mg via ORAL
  Filled 2018-08-20 (×2): qty 2

## 2018-08-20 MED ORDER — INSULIN GLARGINE 100 UNIT/ML ~~LOC~~ SOLN
10.0000 [IU] | Freq: Two times a day (BID) | SUBCUTANEOUS | Status: DC
  Administered 2018-08-20 – 2018-08-22 (×4): 10 [IU] via SUBCUTANEOUS
  Filled 2018-08-20 (×5): qty 0.1

## 2018-08-20 NOTE — Progress Notes (Signed)
  Echocardiogram 2D Echocardiogram has been performed.  Cory Mullins 08/20/2018, 11:17 AM

## 2018-08-20 NOTE — Progress Notes (Signed)
PROGRESS NOTE                                                                                                                                                                                                             Patient Demographics:    Cory Mullins, is a 37 y.o. male, DOB - 1981/06/03, RUE:[id number]  Admit date - 08/18/2018   Admitting Physician Carron Curie, MD  Outpatient Primary MD for the patient is Barbarann Ehlers  LOS - 2  Chief Complaint  Patient presents with  . GI Bleeding       Brief Narrative  Cory Mullins  is a 37 y.o. male, with past medical history significant for blindness, hypertension and diabetes mellitus who presents today with altered mental status .  As per his roommate that the patient has been falling a lot in the last few days and has been drinking more alcohol than usual.  He was found today on the floor with coffee-ground emesis, unresponsive.  His blood sugar on arrival was 172 and his pulse ox was 86% on room air.  The patient was noted to be hypothermic.  In the emergency room CT of the head was negative but his chest x-ray showed right lower lobe pneumonia and his white blood cell count was elevated.  His troponins were slightly elevated and he was in acute renal failure.  Patient is admitted for aspiration pneumonia with sepsis.    Subjective:   Patient in bed, appears comfortable, denies any headache, no fever, no chest pain or pressure, no shortness of breath , no abdominal pain. No focal weakness.     Assessment  & Plan :     1.  Sepsis due to aspiration pneumonia.  1 out of 2 blood cultures growing MSSA question of contamination, clinically this appears to be aspiration pneumonia due to emesis after alcoholic binge, responded very well clinically to Unasyn which will be continued.  Sepsis pathophysiology has resolved.  Continue supportive care and advance activity.  Likely discharge in the next 1 to 2 days.  ID monitoring  as well.  2.  Nausea and dark emesis caused after alcohol binge.  Could have had mild Mallory-Weiss tear, currently on clear liquids, supportive care with IV Phenergan and Zofran, check KUB, continue IV PPI and oral Pepcid, monitor H&H q 8 hours, type screen and hold.  Mild relative anemia likely due to him dilution.  3.  Alcohol abuse.  Counseled to quit.  At risk  for DTs, placed on Librium and CIWA protocol. He says he drinks once every 1 to 2 weeks.  4.  Legal blindness.  Supportive care.  5.  Hypokalemia.  Replace and monitor.    6.  Mild troponin elevation and non-ACS pattern.  Trend is flat, EKG is nonacute with nonspecific changes, cannot use aspirin due to possible bloody emesis as in #2 above, no beta-blocker currently due to borderline low blood pressures, pending TTE to evaluate EF and wall motion.  7. DM type II.  Low-dose Lantus, gentle sliding scale, on low-dose D5 drip.  Will monitor closely.  Lab Results  Component Value Date   HGBA1C 11.5 (H) 12/29/2017   CBG (last 3)  Recent Labs    08/19/18 1712 08/19/18 2144 08/20/18 0828  GLUCAP 237* 172* 171*     Family Communication  :   None  Code Status :  Full  Disposition Plan  :  Stepdown  Consults  :  None  Procedures  :    TTE   DVT Prophylaxis  :    SCDs    Lab Results  Component Value Date   PLT 235 08/20/2018    Diet :  Diet Order            DIET SOFT Room service appropriate? Yes; Fluid consistency: Thin  Diet effective now               Inpatient Medications Scheduled Meds: . atorvastatin  10 mg Oral Daily  . bisacodyl  10 mg Rectal Daily  . chlordiazePOXIDE  15 mg Oral TID  . Chlorhexidine Gluconate Cloth  6 each Topical Q0600  . docusate sodium  200 mg Oral BID  . famotidine  40 mg Oral BID  . folic acid  1 mg Oral Daily  . insulin aspart  0-5 Units Subcutaneous QHS  . insulin aspart  0-9 Units Subcutaneous TID WC  . insulin glargine  10 Units Subcutaneous BID  . LORazepam  0-4  mg Intravenous Q6H   Followed by  . [START ON 08/21/2018] LORazepam  0-4 mg Intravenous Q12H  . metoprolol tartrate  50 mg Oral BID  . multivitamin with minerals  1 tablet Oral Daily  . mupirocin ointment  1 application Nasal BID  . pantoprazole (PROTONIX) IV  40 mg Intravenous Q12H  . pneumococcal 23 valent vaccine  0.5 mL Intramuscular Tomorrow-1000  . thiamine  100 mg Oral Daily   Or  . thiamine  100 mg Intravenous Daily   Continuous Infusions: . ampicillin-sulbactam (UNASYN) IV 3 g (08/20/18 1610)  . lactated ringers     PRN Meds:.acetaminophen, ipratropium-albuterol, LORazepam **OR** LORazepam, [DISCONTINUED] ondansetron **OR** ondansetron (ZOFRAN) IV, promethazine  Antibiotics  :   Anti-infectives (From admission, onward)   Start     Dose/Rate Route Frequency Ordered Stop   08/19/18 1830  vancomycin (VANCOCIN) 1,250 mg in sodium chloride 0.9 % 250 mL IVPB  Status:  Discontinued     1,250 mg 166.7 mL/hr over 90 Minutes Intravenous Every 24 hours 08/18/18 1813 08/19/18 1118   08/19/18 1200  Ampicillin-Sulbactam (UNASYN) 3 g in sodium chloride 0.9 % 100 mL IVPB     3 g 200 mL/hr over 30 Minutes Intravenous Every 6 hours 08/19/18 1118     08/18/18 2200  piperacillin-tazobactam (ZOSYN) IVPB 3.375 g  Status:  Discontinued     3.375 g 12.5 mL/hr over 240 Minutes Intravenous Every 8 hours 08/18/18 1813 08/19/18 1118   08/18/18 1815  vancomycin (VANCOCIN)  IVPB 1000 mg/200 mL premix     1,000 mg 200 mL/hr over 60 Minutes Intravenous  Once 08/18/18 1813 08/18/18 1925   08/18/18 1600  vancomycin (VANCOCIN) IVPB 1000 mg/200 mL premix     1,000 mg 200 mL/hr over 60 Minutes Intravenous  Once 08/18/18 1547 08/18/18 1803   08/18/18 1600  piperacillin-tazobactam (ZOSYN) IVPB 3.375 g     3.375 g 100 mL/hr over 30 Minutes Intravenous  Once 08/18/18 1547 08/18/18 1645          Objective:   Vitals:   08/20/18 0511 08/20/18 0601 08/20/18 0602 08/20/18 0807  BP: (!) 143/75  (!) 156/83  (!) 146/88  Pulse:   75 69  Resp: 15  12 16   Temp: 98.8 F (37.1 C) 98.1 F (36.7 C)  98.8 F (37.1 C)  TempSrc: Oral Oral  Oral  SpO2:   97% 100%  Weight:        Wt Readings from Last 3 Encounters:  08/18/18 104.3 kg  07/26/18 104.3 kg  07/17/18 108.9 kg     Intake/Output Summary (Last 24 hours) at 08/20/2018 0945 Last data filed at 08/20/2018 0700 Gross per 24 hour  Intake 1690 ml  Output 1820 ml  Net -130 ml     Physical Exam  Awake Alert, Oriented X 3, No new F.N deficits, Normal affect [place].AT,PERRAL Supple Neck,No JVD, No cervical lymphadenopathy appriciated.  Symmetrical Chest wall movement, Good air movement bilaterally, few rales RLL RRR,No Gallops, Rubs or new Murmurs, No Parasternal Heave +ve B.Sounds, Abd Soft, No tenderness, No organomegaly appriciated, No rebound - guarding or rigidity. No Cyanosis, Clubbing or edema, No new Rash or bruise      Data Review:    CBC Recent Labs  Lab 08/18/18 1531 08/18/18 1540 08/19/18 0612 08/19/18 1316 08/19/18 2141 08/20/18 0335  WBC 28.8*  --  23.0*  --   --  14.5*  HGB 14.3 16.0 12.5* 11.1* 10.9* 11.0*  HCT 47.3 47.0 40.0 38.3* 35.8* 35.8*  PLT 262  --  252  --   --  235  MCV 91.0  --  87.0  --   --  87.7  MCH 27.5  --  27.2  --   --  27.0  MCHC 30.2  --  31.3  --   --  30.7  RDW 12.2  --  12.5  --   --  12.5  LYMPHSABS 0.9  --   --   --   --   --   MONOABS 2.1*  --   --   --   --   --   EOSABS 0.0  --   --   --   --   --   BASOSABS 0.1  --   --   --   --   --     Chemistries  Recent Labs  Lab 08/18/18 1531 08/18/18 1540 08/19/18 0612 08/20/18 0335  NA 137 141 141 140  K 3.3* 3.4* 3.4* 3.4*  CL 103 104 111 111  CO2 18*  --  21* 24  GLUCOSE 185* 183* 160* 179*  BUN 22* 24* 19 10  CREATININE 1.95* 2.10* 1.24 0.94  CALCIUM 9.4  --  9.0 9.4  MG  --   --  2.1 2.1  AST 25  --   --   --   ALT 16  --   --   --   ALKPHOS 58  --   --   --  BILITOT 0.4  --   --   --     ------------------------------------------------------------------------------------------------------------------ No results for input(s): CHOL, HDL, LDLCALC, TRIG, CHOLHDL, LDLDIRECT in the last 72 hours.  Lab Results  Component Value Date   HGBA1C 11.5 (H) 12/29/2017   ------------------------------------------------------------------------------------------------------------------ Recent Labs    08/18/18 1531  TSH 1.222   ------------------------------------------------------------------------------------------------------------------ No results for input(s): VITAMINB12, FOLATE, FERRITIN, TIBC, IRON, RETICCTPCT in the last 72 hours.  Coagulation profile No results for input(s): INR, PROTIME in the last 168 hours.  No results for input(s): DDIMER in the last 72 hours.  Cardiac Enzymes Recent Labs  Lab 08/18/18 2014 08/19/18 0055 08/19/18 0612  TROPONINI 0.13* 0.12* 0.09*   ------------------------------------------------------------------------------------------------------------------ No results found for: BNP  Micro Results Recent Results (from the past 240 hour(s))  Blood culture (routine x 2)     Status: None (Preliminary result)   Collection Time: 08/18/18  3:27 PM  Result Value Ref Range Status   Specimen Description BLOOD BLOOD LEFT HAND  Final   Special Requests   Final    BOTTLES DRAWN AEROBIC ONLY Blood Culture adequate volume   Culture   Final    NO GROWTH < 24 HOURS Performed at [facility], [address] [place], [place] [address]    Report Status PENDING  Incomplete  Blood culture (routine x 2)     Status: Abnormal (Preliminary result)   Collection Time: 08/18/18  3:32 PM  Result Value Ref Range Status   Specimen Description BLOOD RIGHT ANTECUBITAL  Final   Special Requests   Final    BOTTLES DRAWN AEROBIC AND ANAEROBIC Blood Culture adequate volume   Culture  Setup Time   Final    GRAM POSITIVE COCCI ANAEROBIC BOTTLE ONLY Organism  ID to follow CRITICAL RESULT CALLED TO, READ BACK BY AND VERIFIED WITH: PHARMD RACHEL R 1106  [id number] FCP Performed at [facility], [address] [place], [place] [address]    Culture STAPHYLOCOCCUS AUREUS (A)  Final   Report Status PENDING  Incomplete  Blood Culture ID Panel (Reflexed)     Status: Abnormal   Collection Time: 08/18/18  3:32 PM  Result Value Ref Range Status   Enterococcus species NOT DETECTED NOT DETECTED Final   Listeria monocytogenes NOT DETECTED NOT DETECTED Final   Staphylococcus species DETECTED (A) NOT DETECTED Final    Comment: CRITICAL RESULT CALLED TO, READ BACK BY AND VERIFIED WITH: PHARMD RACHEL R 1106  [id number] FCP    Staphylococcus aureus (BCID) DETECTED (A) NOT DETECTED Final    Comment: Methicillin (oxacillin) susceptible Staphylococcus aureus (MSSA). Preferred therapy is anti staphylococcal beta lactam antibiotic (Cefazolin or Nafcillin), unless clinically contraindicated. CRITICAL RESULT CALLED TO, READ BACK BY AND VERIFIED WITH: PHARMD RACHEL R 1106  [id number] FCP    Methicillin resistance NOT DETECTED NOT DETECTED Final   Streptococcus species NOT DETECTED NOT DETECTED Final   Streptococcus agalactiae NOT DETECTED NOT DETECTED Final   Streptococcus pneumoniae NOT DETECTED NOT DETECTED Final   Streptococcus pyogenes NOT DETECTED NOT DETECTED Final   Acinetobacter baumannii NOT DETECTED NOT DETECTED Final   Enterobacteriaceae species NOT DETECTED NOT DETECTED Final   Enterobacter cloacae complex NOT DETECTED NOT DETECTED Final   Escherichia coli NOT DETECTED NOT DETECTED Final   Klebsiella oxytoca NOT DETECTED NOT DETECTED Final   Klebsiella pneumoniae NOT DETECTED NOT DETECTED Final   Proteus species NOT DETECTED NOT DETECTED Final   Serratia marcescens NOT DETECTED NOT DETECTED Final   Haemophilus influenzae  NOT DETECTED NOT DETECTED Final   Neisseria meningitidis NOT DETECTED NOT DETECTED Final   Pseudomonas aeruginosa NOT DETECTED NOT  DETECTED Final   Candida albicans NOT DETECTED NOT DETECTED Final   Candida glabrata NOT DETECTED NOT DETECTED Final   Candida krusei NOT DETECTED NOT DETECTED Final   Candida parapsilosis NOT DETECTED NOT DETECTED Final   Candida tropicalis NOT DETECTED NOT DETECTED Final    Comment: Performed at [facility], [address], [place], [place] [address]  MRSA PCR Screening     Status: Abnormal   Collection Time: 08/19/18 12:41 AM  Result Value Ref Range Status   MRSA by PCR POSITIVE (A) NEGATIVE Final    Comment: RESULT CALLED TO, READ BACK BY AND VERIFIED WITH: RN T. Davene Costain [phone] @0524  THANEY        The GeneXpert MRSA Assay (FDA approved for NASAL specimens only), is one component of a comprehensive MRSA colonization surveillance program. It is not intended to diagnose MRSA infection nor to guide or monitor treatment for MRSA infections. Performed at [facility], [address] [place], [place] [address]     Radiology Reports Ct Head Wo Contrast  Result Date: 08/18/2018 CLINICAL DATA:  Altered level of consciousness. EXAM: CT HEAD WITHOUT CONTRAST TECHNIQUE: Contiguous axial images were obtained from the base of the skull through the vertex without intravenous contrast. COMPARISON:  None. FINDINGS: Brain: No evidence of acute infarction, hemorrhage, hydrocephalus, extra-axial collection or mass lesion/mass effect. Vascular: No hyperdense vessel or unexpected calcification. Skull: Normal. Negative for fracture or focal lesion. Sinuses/Orbits: No acute finding. Other: None. IMPRESSION: Normal head CT. Electronically Signed   By: Lupita Raider, M.D.   On: 08/18/2018 16:52   Dg Chest Port 1 View  Result Date: 08/18/2018 CLINICAL DATA:  Hypoxia. EXAM: PORTABLE CHEST 1 VIEW COMPARISON:  Radiographs of July 17, 2018. FINDINGS: The heart size and mediastinal contours are within normal limits. No pneumothorax or pleural effusion is noted. Left lung is clear. New right  midlung and basilar opacity is noted concerning for possible pneumonia. The visualized skeletal structures are unremarkable. IMPRESSION: New right lung opacity is noted concerning for pneumonia. Electronically Signed   By: Lupita Raider, M.D.   On: 08/18/2018 16:23   Dg Shoulder Right Port  Result Date: 08/18/2018 CLINICAL DATA:  Right shoulder pain.  Unresponsive patient. EXAM: PORTABLE RIGHT SHOULDER COMPARISON:  None. FINDINGS: Single AP view of the shoulder demonstrates no grossly displaced fracture. Visualized right hemithorax is unremarkable. IMPRESSION: No grossly displaced fracture of the right shoulder. Recommend dedicated views of the shoulder when patient clinically able to exclude the possibility of a subtle fracture or dislocation. Electronically Signed   By: Annia Belt M.D.   On: 08/18/2018 18:01   Dg Abd Portable 1v  Result Date: 08/19/2018 CLINICAL DATA:  Nausea vomiting. EXAM: PORTABLE ABDOMEN - 1 VIEW COMPARISON:  CT, 08/18/2018 FINDINGS: Normal bowel gas pattern. Mild colonic stool burden stable from the previous day's CT. No evidence of renal or ureteral stones. Soft tissues are unremarkable. No skeletal abnormality. IMPRESSION: 1. No acute findings.  No bowel obstruction. 2. Mild colonic stool burden. Electronically Signed   By: Amie Portland M.D.   On: 08/19/2018 11:09   Ct Renal Stone Study  Result Date: 08/18/2018 CLINICAL DATA:  Flank pain, vomiting, acute renal failure. Coffee ground emesis since this morning. EXAM: CT ABDOMEN AND PELVIS WITHOUT CONTRAST TECHNIQUE: Multidetector CT imaging of the abdomen and pelvis was performed  following the standard protocol without IV contrast. COMPARISON:  None. FINDINGS: Lower chest: Patchy dense consolidations within the RIGHT lower lobe, incompletely imaged. Hepatobiliary: Streak artifact limits characterization of the liver and gallbladder. However, there is no focal mass or lesion identified within the liver and no secondary signs  of acute cholecystitis. No evidence of bile duct dilatation. Pancreas: Unremarkable. No pancreatic ductal dilatation or surrounding inflammatory changes. Spleen: Normal in size without focal abnormality. Adrenals/Urinary Tract: Adrenal glands appear normal. Kidneys are unremarkable without mass, stone or hydronephrosis. No ureteral or bladder calculi identified. Bladder appears normal. Stomach/Bowel: No dilated large or small bowel loops. No evidence of bowel wall inflammation. Appendix is normal. Stomach is unremarkable. Vascular/Lymphatic: No significant vascular findings are present. No enlarged abdominal or pelvic lymph nodes. Reproductive: Prostate is unremarkable. Other: No free fluid or abscess collection. No free intraperitoneal air. Musculoskeletal: No acute or suspicious osseous finding. IMPRESSION: 1. Patchy dense consolidations within the RIGHT lower lung, incompletely imaged, compatible with pneumonia or aspiration. 2. No acute findings within the abdomen or pelvis, with mild study limitations detailed above. No bowel obstruction or evidence of bowel wall inflammation seen. No evidence of acute solid organ abnormality. No renal or ureteral calculi. No free fluid or abscess. Appendix is normal. Electronically Signed   By: Bary Richard M.D.   On: 08/18/2018 17:37    Time Spent in minutes  30   Susa Raring M.D on 08/20/2018 at 9:45 AM   To page go to [email or web address] - password [facility]

## 2018-08-20 NOTE — Progress Notes (Signed)
[facility]   Reason for visit: Follow up on positive blood cultures  Interval History: remains to have just one anaerobic blood culture positive for MSSA.  WBC 14.5, down from 23, remains afebrile.  No new complaints.  No associated rash or diarrhea.    Physical Exam: Constitutional:  Vitals:   08/20/18 0602 08/20/18 0807  BP: (!) 156/83 (!) 146/88  Pulse: 75 69  Resp: 12 16  Temp:  98.8 F (37.1 C)  SpO2: 97% 100%   patient appears in NAD Respiratory: Normal respiratory effort; CTA B Cardiovascular: RRR GI: soft, nt, nd  Review of Systems: Constitutional: negative for fevers and chills Gastrointestinal: negative for diarrhea Integument/breast: negative for rash  Lab Results  Component Value Date   WBC 14.5 (H) 08/20/2018   HGB 11.0 (L) 08/20/2018   HCT 35.8 (L) 08/20/2018   MCV 87.7 08/20/2018   PLT 235 08/20/2018    Lab Results  Component Value Date   CREATININE 0.94 08/20/2018   BUN 10 08/20/2018   NA 140 08/20/2018   K 3.4 (L) 08/20/2018   CL 111 08/20/2018   CO2 24 08/20/2018    Lab Results  Component Value Date   ALT 16 08/18/2018   AST 25 08/18/2018   ALKPHOS 58 08/18/2018     Microbiology: Recent Results (from the past 240 hour(s))  Blood culture (routine x 2)     Status: None (Preliminary result)   Collection Time: 08/18/18  3:27 PM  Result Value Ref Range Status   Specimen Description BLOOD BLOOD LEFT HAND  Final   Special Requests   Final    BOTTLES DRAWN AEROBIC ONLY Blood Culture adequate volume   Culture   Final    NO GROWTH 2 DAYS Performed at [facility], [address], [place], [place] [address]    Report Status PENDING  Incomplete  Blood culture (routine x 2)     Status: Abnormal (Preliminary result)   Collection Time: 08/18/18  3:32 PM  Result Value Ref Range Status   Specimen Description BLOOD RIGHT ANTECUBITAL  Final   Special Requests   Final    BOTTLES DRAWN AEROBIC AND ANAEROBIC Blood  Culture adequate volume   Culture  Setup Time   Final    GRAM POSITIVE COCCI ANAEROBIC BOTTLE ONLY Organism ID to follow CRITICAL RESULT CALLED TO, READ BACK BY AND VERIFIED WITH: PHARMD RACHEL R 1106  [id number] FCP Performed at [facility], [address] [place], [place] [address]    Culture STAPHYLOCOCCUS AUREUS (A)  Final   Report Status PENDING  Incomplete  Blood Culture ID Panel (Reflexed)     Status: Abnormal   Collection Time: 08/18/18  3:32 PM  Result Value Ref Range Status   Enterococcus species NOT DETECTED NOT DETECTED Final   Listeria monocytogenes NOT DETECTED NOT DETECTED Final   Staphylococcus species DETECTED (A) NOT DETECTED Final    Comment: CRITICAL RESULT CALLED TO, READ BACK BY AND VERIFIED WITH: PHARMD RACHEL R 1106  [id number] FCP    Staphylococcus aureus (BCID) DETECTED (A) NOT DETECTED Final    Comment: Methicillin (oxacillin) susceptible Staphylococcus aureus (MSSA). Preferred therapy is anti staphylococcal beta lactam antibiotic (Cefazolin or Nafcillin), unless clinically contraindicated. CRITICAL RESULT CALLED TO, READ BACK BY AND VERIFIED WITH: PHARMD RACHEL R 1106  [id number] FCP    Methicillin resistance NOT DETECTED NOT DETECTED Final   Streptococcus species NOT DETECTED NOT DETECTED Final   Streptococcus agalactiae  NOT DETECTED NOT DETECTED Final   Streptococcus pneumoniae NOT DETECTED NOT DETECTED Final   Streptococcus pyogenes NOT DETECTED NOT DETECTED Final   Acinetobacter baumannii NOT DETECTED NOT DETECTED Final   Enterobacteriaceae species NOT DETECTED NOT DETECTED Final   Enterobacter cloacae complex NOT DETECTED NOT DETECTED Final   Escherichia coli NOT DETECTED NOT DETECTED Final   Klebsiella oxytoca NOT DETECTED NOT DETECTED Final   Klebsiella pneumoniae NOT DETECTED NOT DETECTED Final   Proteus species NOT DETECTED NOT DETECTED Final   Serratia marcescens NOT DETECTED NOT DETECTED Final   Haemophilus influenzae NOT DETECTED NOT DETECTED  Final   Neisseria meningitidis NOT DETECTED NOT DETECTED Final   Pseudomonas aeruginosa NOT DETECTED NOT DETECTED Final   Candida albicans NOT DETECTED NOT DETECTED Final   Candida glabrata NOT DETECTED NOT DETECTED Final   Candida krusei NOT DETECTED NOT DETECTED Final   Candida parapsilosis NOT DETECTED NOT DETECTED Final   Candida tropicalis NOT DETECTED NOT DETECTED Final    Comment: Performed at [facility], [address] [place], [place] [address]  MRSA PCR Screening     Status: Abnormal   Collection Time: 08/19/18 12:41 AM  Result Value Ref Range Status   MRSA by PCR POSITIVE (A) NEGATIVE Final    Comment: RESULT CALLED TO, READ BACK BY AND VERIFIED WITH: RN T. Davene Costain [phone] @0524  THANEY        The GeneXpert MRSA Assay (FDA approved for NASAL specimens only), is one component of a comprehensive MRSA colonization surveillance program. It is not intended to diagnose MRSA infection nor to guide or monitor treatment for MRSA infections. Performed at [facility], [address], [place], [place] [address]     Impression/Plan:  1. Positive blood culture - though is Staph aureus, significance unclear.  His clinical picture is most c/w aspiration with his history.  He will continue with amp/sulbactam and will continue to monitor cultures. Will repeat blood cultures   Getting TTE for cardiac issues and can check for vegetation as well  2.  aspiration pneumonia - on appropriate therapy and improved clinically  3.  Leukocytosis - improved, c/w #2.

## 2018-08-20 NOTE — Evaluation (Signed)
Physical Therapy Evaluation Patient Details Name: Cory Mullins MRN: [id number] DOB: Mar 09, 1981 Today's Date: 08/20/2018   History of Present Illness  Cory Mullins  is a 37 y.o. male, with past medical history significant for blindness, hypertension and diabetes mellitus who presents today with altered mental status.  Found on floor by roomate with coffee ground emesis.  Admitted with aspiration pnuemonia and sepsis.   Clinical Impression  Patient presents with decreased independence with mobility due to decreased strength, decreased balance and decreased activity tolerance.  Currently at min to minguard level for ambulation in hallway.  He will benefit from skilled PT in the acute setting to allow return home with intermittent assist from roommate.  Will need to negotiate stairs prior to d/c for safe home entry.     Follow Up Recommendations No PT follow up    Equipment Recommendations  None recommended by PT    Recommendations for Other Services       Precautions / Restrictions Precautions Precautions: Fall Precaution Comments: reports totally blind since age 36      Mobility  Bed Mobility Overal bed mobility: Modified Independent             General bed mobility comments: increased time, cues for positioning out of bed with feet due to rail  Transfers Overall transfer level: Needs assistance Equipment used: None Transfers: Sit to/from Stand Sit to Stand: Min guard         General transfer comment: for balance, increased time to rise  Ambulation/Gait Ambulation/Gait assistance: Min assist;Min guard Gait Distance (Feet): 200 Feet Assistive device: 1 person hand held assist;None Gait Pattern/deviations: Step-through pattern;Decreased stride length;Trunk flexed;Wide base of support     General Gait Details: initially assist with hand hold and at belt for balance, then with just guidance due to blindness with pt lumbering and slumping over my shoulder at times  reported feeling lethargic  Stairs            Wheelchair Mobility    Modified Rankin (Stroke Patients Only)       Balance Overall balance assessment: Needs assistance   Sitting balance-Leahy Scale: Good       Standing balance-Leahy Scale: Fair                               Pertinent Vitals/Pain Pain Assessment: Faces Faces Pain Scale: Hurts little more Pain Location: back and R shoulder Pain Descriptors / Indicators: Aching;Sore Pain Intervention(s): Repositioned;Monitored during session    Home Living Family/patient expects to be discharged to:: Private residence Living Arrangements: Non-relatives/Friends(roomate) Available Help at Discharge: Friend(s) Type of Home: Apartment Home Access: Stairs to enter Entrance Stairs-Rails: Doctor, general practice of Steps: 17 Home Layout: One level Home Equipment: Other (comment)(white cane)      Prior Function Level of Independence: Independent         Comments: works for [employer] for [employer]; takes [employer]   Dominant Hand: Right    Extremity/Trunk Assessment   Upper Extremity Assessment Upper Extremity Assessment: Overall WFL for tasks assessed    Lower Extremity Assessment Lower Extremity Assessment: Overall WFL for tasks assessed       Communication   Communication: No difficulties  Cognition Arousal/Alertness: Awake/alert Behavior During Therapy: WFL for tasks assessed/performed Overall Cognitive Status: No family/caregiver present to determine baseline cognitive functioning  General Comments: slower processing, mild lethargy      General Comments General comments (skin integrity, edema, etc.): BP 152/74, SpO2 95% on RA with ambulation, HR 61 at rest, 83 with ambulation    Exercises     Assessment/Plan    PT Assessment Patient needs continued PT services  PT Problem List Decreased activity  tolerance;Decreased balance;Decreased mobility;Decreased safety awareness;Decreased strength       PT Treatment Interventions DME instruction;Therapeutic activities;Gait training;Therapeutic exercise;Patient/family education;Functional mobility training;Stair training;Balance training    PT Goals (Current goals can be found in the Care Plan section)  Acute Rehab PT Goals Patient Stated Goal: to get stronger PT Goal Formulation: With patient Time For Goal Achievement: 08/27/18 Potential to Achieve Goals: Good    Frequency Min 3X/week   Barriers to discharge        Co-evaluation               AM-PAC PT "6 Clicks" Daily Activity  Outcome Measure Difficulty turning over in bed (including adjusting bedclothes, sheets and blankets)?: None Difficulty moving from lying on back to sitting on the side of the bed? : A Little Difficulty sitting down on and standing up from a chair with arms (e.g., wheelchair, bedside commode, etc,.)?: Unable Help needed moving to and from a bed to chair (including a wheelchair)?: A Little Help needed walking in hospital room?: A Little Help needed climbing 3-5 steps with a railing? : A Little 6 Click Score: 17    End of Session Equipment Utilized During Treatment: Gait belt Activity Tolerance: Patient tolerated treatment well Patient left: with call bell/phone within reach;in chair;with chair alarm set   PT Visit Diagnosis: Other abnormalities of gait and mobility (R26.89);Muscle weakness (generalized) (M62.81);History of falling (Z91.81)    Time: 2130-8657 PT Time Calculation (min) (ACUTE ONLY): 30 min   Charges:   PT Evaluation $PT Eval Moderate Complexity: 1 Mod PT Treatments $Gait Training: 8-22 mins        Sheran Lawless, [facility] [phone] 08/20/2018   Elray Mcgregor 08/20/2018, 5:13 PM

## 2018-08-20 NOTE — Progress Notes (Signed)
[facility]  [facility] Infusion Coordinator will follow pt with ID to support home infusion pharmacy services if needed at DC home.  If patient discharges after hours, please call [phone].   Cory Mullins 08/20/2018, 5:29 PM

## 2018-08-21 LAB — CULTURE, BLOOD (ROUTINE X 2): Special Requests: ADEQUATE

## 2018-08-21 LAB — GLUCOSE, CAPILLARY
GLUCOSE-CAPILLARY: 110 mg/dL — AB (ref 70–99)
Glucose-Capillary: 154 mg/dL — ABNORMAL HIGH (ref 70–99)
Glucose-Capillary: 131 mg/dL — ABNORMAL HIGH (ref 70–99)
Glucose-Capillary: 178 mg/dL — ABNORMAL HIGH (ref 70–99)

## 2018-08-21 LAB — BASIC METABOLIC PANEL
Anion gap: 6 (ref 5–15)
CALCIUM: 9.8 mg/dL (ref 8.9–10.3)
CO2: 23 mmol/L (ref 22–32)
CREATININE: 1 mg/dL (ref 0.61–1.24)
Chloride: 109 mmol/L (ref 98–111)
GFR calc Af Amer: >60 mL/min (ref >60)
Glucose, Bld: 116 mg/dL — ABNORMAL HIGH (ref 70–99)
Potassium: 3.4 mmol/L — ABNORMAL LOW (ref 3.5–5.1)
SODIUM: 138 mmol/L (ref 135–145)

## 2018-08-21 LAB — CBC
HCT: 34.5 % — ABNORMAL LOW (ref 39.0–52.0)
Hemoglobin: 10.8 g/dL — ABNORMAL LOW (ref 13.0–17.0)
MCH: 27.3 pg (ref 26.0–34.0)
MCHC: 31.3 g/dL (ref 30.0–36.0)
MCV: 87.1 fL (ref 80.0–100.0)
PLATELETS: 252 10*3/uL (ref 150–400)
RBC: 3.96 MIL/uL — ABNORMAL LOW (ref 4.22–5.81)
RDW: 12.1 % (ref 11.5–15.5)
WBC: 12.8 10*3/uL — ABNORMAL HIGH (ref 4.0–10.5)
nRBC: 0 % (ref 0.0–0.2)

## 2018-08-21 MED ORDER — POTASSIUM CHLORIDE CRYS ER 20 MEQ PO TBCR
40.0000 meq | EXTENDED_RELEASE_TABLET | Freq: Once | ORAL | Status: AC
  Administered 2018-08-21: 40 meq via ORAL
  Filled 2018-08-21: qty 2

## 2018-08-21 MED ORDER — PANTOPRAZOLE SODIUM 40 MG PO TBEC
40.0000 mg | DELAYED_RELEASE_TABLET | Freq: Two times a day (BID) | ORAL | Status: DC
  Administered 2018-08-21 – 2018-08-22 (×2): 40 mg via ORAL
  Filled 2018-08-21 (×2): qty 1

## 2018-08-21 NOTE — Progress Notes (Signed)
PROGRESS NOTE                                                                                                                                                                                                             Patient Demographics:    Cory Mullins, is a 37 y.o. male, DOB - 03/10/1981, ZOX:[id number]  Admit date - 08/18/2018   Admitting Physician Carron Curie, MD  Outpatient Primary MD for the patient is Barbarann Ehlers  LOS - 3  Chief Complaint  Patient presents with  . GI Bleeding       Brief Narrative  Cory Mullins  is a 37 y.o. male, with past medical history significant for blindness, hypertension and diabetes mellitus who presents today with altered mental status .  As per his roommate that the patient has been falling a lot in the last few days and has been drinking more alcohol than usual.  He was found today on the floor with coffee-ground emesis, unresponsive.  His blood sugar on arrival was 172 and his pulse ox was 86% on room air.  The patient was noted to be hypothermic.  In the emergency room CT of the head was negative but his chest x-ray showed right lower lobe pneumonia and his white blood cell count was elevated.  His troponins were slightly elevated and he was in acute renal failure.  Patient is admitted for aspiration pneumonia with sepsis.    Subjective:   Patient in bed, appears comfortable, denies any headache, no fever, no chest pain or pressure, no shortness of breath , no abdominal pain. No focal weakness.   Assessment  & Plan :     1.  Sepsis due to aspiration pneumonia.  1 out of 2 blood cultures growing MSSA question of contamination, clinically this appears to be aspiration pneumonia due to emesis after alcoholic binge, responded very well clinically to Unasyn which will be continued.  Sepsis pathophysiology has resolved.  P blood cultures drawn on 08/20/2018 are negative to date, if they remain negative for 48 hours will be  discharged home on oral antibiotics for a total of 2 weeks.  2.  Nausea and dark emesis caused after alcohol binge.  Could have had mild Mallory-Weiss tear, currently on clear liquids, supportive care with IV Phenergan and Zofran, stable KUB and HHH, switch to oral PPI and monitor.  3.  Alcohol abuse.  Counseled to quit.  At risk for DTs, placed on Librium and CIWA protocol. He  says he drinks once every 1 to 2 weeks.  4.  Legal blindness.  Supportive care.  5.  Hypokalemia.  Replace and monitor.    6.  Mild troponin elevation and non-ACS pattern.  Trend is flat, EKG is nonacute with nonspecific changes, cannot use aspirin due to possible bloody emesis as in #2 above, no beta-blocker currently due to borderline low blood pressures, stable TTE to evaluate EF and wall motion.  7. DM type II.  Low-dose Lantus, gentle sliding scale, on low-dose D5 drip.  Will monitor closely.  Lab Results  Component Value Date   HGBA1C 11.5 (H) 12/29/2017   CBG (last 3)  Recent Labs    08/20/18 1733 08/20/18 2301 08/21/18 0831  GLUCAP 136* 129* 110*     Family Communication  :   None  Code Status :  Full  Disposition Plan  :  Stepdown  Consults  :  None  Procedures  :    TTE - Technically difficult study with poor acoustic windows. Normal LV size with EF 60-65%, normal diastolic function. The RV was poorly  visualized. No definite valvular abnormalities were noted.   DVT Prophylaxis  :    SCDs    Lab Results  Component Value Date   PLT 252 08/21/2018    Diet :  Diet Order            DIET SOFT Room service appropriate? Yes; Fluid consistency: Thin  Diet effective now               Inpatient Medications Scheduled Meds: . atorvastatin  10 mg Oral Daily  . bisacodyl  10 mg Rectal Daily  . chlordiazePOXIDE  15 mg Oral TID  . Chlorhexidine Gluconate Cloth  6 each Topical Q0600  . docusate sodium  200 mg Oral BID  . famotidine  40 mg Oral BID  . folic acid  1 mg Oral Daily  .  insulin aspart  0-5 Units Subcutaneous QHS  . insulin aspart  0-9 Units Subcutaneous TID WC  . insulin glargine  10 Units Subcutaneous BID  . LORazepam  0-4 mg Intravenous Q12H  . metoprolol tartrate  50 mg Oral BID  . multivitamin with minerals  1 tablet Oral Daily  . mupirocin ointment  1 application Nasal BID  . pantoprazole (PROTONIX) IV  40 mg Intravenous Q12H  . pneumococcal 23 valent vaccine  0.5 mL Intramuscular Tomorrow-1000  . thiamine  100 mg Oral Daily   Or  . thiamine  100 mg Intravenous Daily   Continuous Infusions: . ampicillin-sulbactam (UNASYN) IV 3 g (08/21/18 0604)   PRN Meds:.acetaminophen, ipratropium-albuterol, LORazepam **OR** LORazepam, [DISCONTINUED] ondansetron **OR** ondansetron (ZOFRAN) IV, promethazine  Antibiotics  :   Anti-infectives (From admission, onward)   Start     Dose/Rate Route Frequency Ordered Stop   08/19/18 1830  vancomycin (VANCOCIN) 1,250 mg in sodium chloride 0.9 % 250 mL IVPB  Status:  Discontinued     1,250 mg 166.7 mL/hr over 90 Minutes Intravenous Every 24 hours 08/18/18 1813 08/19/18 1118   08/19/18 1200  Ampicillin-Sulbactam (UNASYN) 3 g in sodium chloride 0.9 % 100 mL IVPB     3 g 200 mL/hr over 30 Minutes Intravenous Every 6 hours 08/19/18 1118     08/18/18 2200  piperacillin-tazobactam (ZOSYN) IVPB 3.375 g  Status:  Discontinued     3.375 g 12.5 mL/hr over 240 Minutes Intravenous Every 8 hours 08/18/18 1813 08/19/18 1118   08/18/18 1815  vancomycin (VANCOCIN) IVPB  1000 mg/200 mL premix     1,000 mg 200 mL/hr over 60 Minutes Intravenous  Once 08/18/18 1813 08/18/18 1925   08/18/18 1600  vancomycin (VANCOCIN) IVPB 1000 mg/200 mL premix     1,000 mg 200 mL/hr over 60 Minutes Intravenous  Once 08/18/18 1547 08/18/18 1803   08/18/18 1600  piperacillin-tazobactam (ZOSYN) IVPB 3.375 g     3.375 g 100 mL/hr over 30 Minutes Intravenous  Once 08/18/18 1547 08/18/18 1645          Objective:   Vitals:   08/20/18 1017 08/20/18  1532 08/20/18 2211 08/21/18 0513  BP: (!) 144/77 (!) 156/86 (!) 159/93 (!) 157/86  Pulse: 72 69 62 (!) 58  Resp:  16 18 18   Temp:  98.3 F (36.8 C) 99.7 F (37.6 C) 98.4 F (36.9 C)  TempSrc:  Oral Oral Oral  SpO2:  99% 98% 97%  Weight:        Wt Readings from Last 3 Encounters:  08/18/18 104.3 kg  07/26/18 104.3 kg  07/17/18 108.9 kg     Intake/Output Summary (Last 24 hours) at 08/21/2018 1228 Last data filed at 08/21/2018 0813 Gross per 24 hour  Intake 326.25 ml  Output 2425 ml  Net -2098.75 ml     Physical Exam  Awake Alert, Oriented X 3, No new F.N deficits, Normal affect [place].AT,PERRAL Supple Neck,No JVD, No cervical lymphadenopathy appriciated.  Symmetrical Chest wall movement, Good air movement bilaterally, CTAB RRR,No Gallops, Rubs or new Murmurs, No Parasternal Heave +ve B.Sounds, Abd Soft, No tenderness, No organomegaly appriciated, No rebound - guarding or rigidity. No Cyanosis, Clubbing or edema, No new Rash or bruise    Data Review:    CBC Recent Labs  Lab 08/18/18 1531  08/19/18 0612 08/19/18 1316 08/19/18 2141 08/20/18 0335 08/21/18 0348  WBC 28.8*  --  23.0*  --   --  14.5* 12.8*  HGB 14.3   < > 12.5* 11.1* 10.9* 11.0* 10.8*  HCT 47.3   < > 40.0 38.3* 35.8* 35.8* 34.5*  PLT 262  --  252  --   --  235 252  MCV 91.0  --  87.0  --   --  87.7 87.1  MCH 27.5  --  27.2  --   --  27.0 27.3  MCHC 30.2  --  31.3  --   --  30.7 31.3  RDW 12.2  --  12.5  --   --  12.5 12.1  LYMPHSABS 0.9  --   --   --   --   --   --   MONOABS 2.1*  --   --   --   --   --   --   EOSABS 0.0  --   --   --   --   --   --   BASOSABS 0.1  --   --   --   --   --   --    < > = values in this interval not displayed.    Chemistries  Recent Labs  Lab 08/18/18 1531 08/18/18 1540 08/19/18 0612 08/20/18 0335 08/21/18 0348  NA 137 141 141 140 138  K 3.3* 3.4* 3.4* 3.4* 3.4*  CL 103 104 111 111 109  CO2 18*  --  21* 24 23  GLUCOSE 185* 183* 160* 179* 116*  BUN 22*  24* 19 10 <5*  CREATININE 1.95* 2.10* 1.24 0.94 1.00  CALCIUM 9.4  --  9.0 9.4 9.8  MG  --   --  2.1 2.1  --   AST 25  --   --   --   --   ALT 16  --   --   --   --   ALKPHOS 58  --   --   --   --   BILITOT 0.4  --   --   --   --    ------------------------------------------------------------------------------------------------------------------ No results for input(s): CHOL, HDL, LDLCALC, TRIG, CHOLHDL, LDLDIRECT in the last 72 hours.  Lab Results  Component Value Date   HGBA1C 11.5 (H) 12/29/2017   ------------------------------------------------------------------------------------------------------------------ Recent Labs    08/18/18 1531  TSH 1.222   ------------------------------------------------------------------------------------------------------------------ No results for input(s): VITAMINB12, FOLATE, FERRITIN, TIBC, IRON, RETICCTPCT in the last 72 hours.  Coagulation profile No results for input(s): INR, PROTIME in the last 168 hours.  No results for input(s): DDIMER in the last 72 hours.  Cardiac Enzymes Recent Labs  Lab 08/18/18 2014 08/19/18 0055 08/19/18 0612  TROPONINI 0.13* 0.12* 0.09*   ------------------------------------------------------------------------------------------------------------------ No results found for: BNP  Micro Results Recent Results (from the past 240 hour(s))  Blood culture (routine x 2)     Status: None (Preliminary result)   Collection Time: 08/18/18  3:27 PM  Result Value Ref Range Status   Specimen Description BLOOD BLOOD LEFT HAND  Final   Special Requests   Final    BOTTLES DRAWN AEROBIC ONLY Blood Culture adequate volume   Culture   Final    NO GROWTH 2 DAYS Performed at [facility], [address], [place], [place] [address]    Report Status PENDING  Incomplete  Blood culture (routine x 2)     Status: Abnormal   Collection Time: 08/18/18  3:32 PM  Result Value Ref Range Status   Specimen Description BLOOD  RIGHT ANTECUBITAL  Final   Special Requests   Final    BOTTLES DRAWN AEROBIC AND ANAEROBIC Blood Culture adequate volume   Culture  Setup Time   Final    GRAM POSITIVE COCCI ANAEROBIC BOTTLE ONLY CRITICAL RESULT CALLED TO, READ BACK BY AND VERIFIED WITH: PHARMD RACHEL R 1106  [id number] FCP Performed at [facility], [address], [place], [place] [address]    Culture STAPHYLOCOCCUS AUREUS (A)  Final   Report Status 08/21/2018 FINAL  Final   Organism ID, Bacteria STAPHYLOCOCCUS AUREUS  Final      Susceptibility   Staphylococcus aureus - MIC*    CIPROFLOXACIN >=8 RESISTANT Resistant     ERYTHROMYCIN >=8 RESISTANT Resistant     GENTAMICIN <=0.5 SENSITIVE Sensitive     OXACILLIN 0.5 SENSITIVE Sensitive     TETRACYCLINE <=1 SENSITIVE Sensitive     VANCOMYCIN <=0.5 SENSITIVE Sensitive     TRIMETH/SULFA <=10 SENSITIVE Sensitive     CLINDAMYCIN <=0.25 SENSITIVE Sensitive     RIFAMPIN <=0.5 SENSITIVE Sensitive     Inducible Clindamycin NEGATIVE Sensitive     * STAPHYLOCOCCUS AUREUS  Blood Culture ID Panel (Reflexed)     Status: Abnormal   Collection Time: 08/18/18  3:32 PM  Result Value Ref Range Status   Enterococcus species NOT DETECTED NOT DETECTED Final   Listeria monocytogenes NOT DETECTED NOT DETECTED Final   Staphylococcus species DETECTED (A) NOT DETECTED Final    Comment: CRITICAL RESULT CALLED TO, READ BACK BY AND VERIFIED WITH: PHARMD RACHEL R 1106  [id number] FCP    Staphylococcus aureus (BCID) DETECTED (A) NOT DETECTED Final  Comment: Methicillin (oxacillin) susceptible Staphylococcus aureus (MSSA). Preferred therapy is anti staphylococcal beta lactam antibiotic (Cefazolin or Nafcillin), unless clinically contraindicated. CRITICAL RESULT CALLED TO, READ BACK BY AND VERIFIED WITH: PHARMD RACHEL R 1106  [id number] FCP    Methicillin resistance NOT DETECTED NOT DETECTED Final   Streptococcus species NOT DETECTED NOT DETECTED Final   Streptococcus agalactiae NOT DETECTED NOT  DETECTED Final   Streptococcus pneumoniae NOT DETECTED NOT DETECTED Final   Streptococcus pyogenes NOT DETECTED NOT DETECTED Final   Acinetobacter baumannii NOT DETECTED NOT DETECTED Final   Enterobacteriaceae species NOT DETECTED NOT DETECTED Final   Enterobacter cloacae complex NOT DETECTED NOT DETECTED Final   Escherichia coli NOT DETECTED NOT DETECTED Final   Klebsiella oxytoca NOT DETECTED NOT DETECTED Final   Klebsiella pneumoniae NOT DETECTED NOT DETECTED Final   Proteus species NOT DETECTED NOT DETECTED Final   Serratia marcescens NOT DETECTED NOT DETECTED Final   Haemophilus influenzae NOT DETECTED NOT DETECTED Final   Neisseria meningitidis NOT DETECTED NOT DETECTED Final   Pseudomonas aeruginosa NOT DETECTED NOT DETECTED Final   Candida albicans NOT DETECTED NOT DETECTED Final   Candida glabrata NOT DETECTED NOT DETECTED Final   Candida krusei NOT DETECTED NOT DETECTED Final   Candida parapsilosis NOT DETECTED NOT DETECTED Final   Candida tropicalis NOT DETECTED NOT DETECTED Final    Comment: Performed at [facility], [address], [place], [place] [address]  MRSA PCR Screening     Status: Abnormal   Collection Time: 08/19/18 12:41 AM  Result Value Ref Range Status   MRSA by PCR POSITIVE (A) NEGATIVE Final    Comment: RESULT CALLED TO, READ BACK BY AND VERIFIED WITH: RN T. Davene Costain [phone] @0524  THANEY        The GeneXpert MRSA Assay (FDA approved for NASAL specimens only), is one component of a comprehensive MRSA colonization surveillance program. It is not intended to diagnose MRSA infection nor to guide or monitor treatment for MRSA infections. Performed at [facility], [address] [place], [place] [address]     Radiology Reports Ct Head Wo Contrast  Result Date: 08/18/2018 CLINICAL DATA:  Altered level of consciousness. EXAM: CT HEAD WITHOUT CONTRAST TECHNIQUE: Contiguous axial images were obtained from the base of the skull through the  vertex without intravenous contrast. COMPARISON:  None. FINDINGS: Brain: No evidence of acute infarction, hemorrhage, hydrocephalus, extra-axial collection or mass lesion/mass effect. Vascular: No hyperdense vessel or unexpected calcification. Skull: Normal. Negative for fracture or focal lesion. Sinuses/Orbits: No acute finding. Other: None. IMPRESSION: Normal head CT. Electronically Signed   By: Lupita Raider, M.D.   On: 08/18/2018 16:52   Dg Chest Port 1 View  Result Date: 08/18/2018 CLINICAL DATA:  Hypoxia. EXAM: PORTABLE CHEST 1 VIEW COMPARISON:  Radiographs of July 17, 2018. FINDINGS: The heart size and mediastinal contours are within normal limits. No pneumothorax or pleural effusion is noted. Left lung is clear. New right midlung and basilar opacity is noted concerning for possible pneumonia. The visualized skeletal structures are unremarkable. IMPRESSION: New right lung opacity is noted concerning for pneumonia. Electronically Signed   By: Lupita Raider, M.D.   On: 08/18/2018 16:23   Dg Shoulder Right Port  Result Date: 08/18/2018 CLINICAL DATA:  Right shoulder pain.  Unresponsive patient. EXAM: PORTABLE RIGHT SHOULDER COMPARISON:  None. FINDINGS: Single AP view of the shoulder demonstrates no grossly displaced fracture. Visualized right hemithorax is unremarkable. IMPRESSION: No grossly displaced fracture of the  right shoulder. Recommend dedicated views of the shoulder when patient clinically able to exclude the possibility of a subtle fracture or dislocation. Electronically Signed   By: Annia Belt M.D.   On: 08/18/2018 18:01   Dg Abd Portable 1v  Result Date: 08/19/2018 CLINICAL DATA:  Nausea vomiting. EXAM: PORTABLE ABDOMEN - 1 VIEW COMPARISON:  CT, 08/18/2018 FINDINGS: Normal bowel gas pattern. Mild colonic stool burden stable from the previous day's CT. No evidence of renal or ureteral stones. Soft tissues are unremarkable. No skeletal abnormality. IMPRESSION: 1. No acute findings.   No bowel obstruction. 2. Mild colonic stool burden. Electronically Signed   By: Amie Portland M.D.   On: 08/19/2018 11:09   Ct Renal Stone Study  Result Date: 08/18/2018 CLINICAL DATA:  Flank pain, vomiting, acute renal failure. Coffee ground emesis since this morning. EXAM: CT ABDOMEN AND PELVIS WITHOUT CONTRAST TECHNIQUE: Multidetector CT imaging of the abdomen and pelvis was performed following the standard protocol without IV contrast. COMPARISON:  None. FINDINGS: Lower chest: Patchy dense consolidations within the RIGHT lower lobe, incompletely imaged. Hepatobiliary: Streak artifact limits characterization of the liver and gallbladder. However, there is no focal mass or lesion identified within the liver and no secondary signs of acute cholecystitis. No evidence of bile duct dilatation. Pancreas: Unremarkable. No pancreatic ductal dilatation or surrounding inflammatory changes. Spleen: Normal in size without focal abnormality. Adrenals/Urinary Tract: Adrenal glands appear normal. Kidneys are unremarkable without mass, stone or hydronephrosis. No ureteral or bladder calculi identified. Bladder appears normal. Stomach/Bowel: No dilated large or small bowel loops. No evidence of bowel wall inflammation. Appendix is normal. Stomach is unremarkable. Vascular/Lymphatic: No significant vascular findings are present. No enlarged abdominal or pelvic lymph nodes. Reproductive: Prostate is unremarkable. Other: No free fluid or abscess collection. No free intraperitoneal air. Musculoskeletal: No acute or suspicious osseous finding. IMPRESSION: 1. Patchy dense consolidations within the RIGHT lower lung, incompletely imaged, compatible with pneumonia or aspiration. 2. No acute findings within the abdomen or pelvis, with mild study limitations detailed above. No bowel obstruction or evidence of bowel wall inflammation seen. No evidence of acute solid organ abnormality. No renal or ureteral calculi. No free fluid or  abscess. Appendix is normal. Electronically Signed   By: Bary Richard M.D.   On: 08/18/2018 17:37    Time Spent in minutes  30   Susa Raring M.D on 08/21/2018 at 12:28 PM   To page go to [email or web address] - password [facility]

## 2018-08-21 NOTE — Progress Notes (Signed)
[facility]   Reason for visit: Follow up on positive blood cultures  Interval History: remains to have just one anaerobic blood culture positive for MSSA.  Repeat cultures sent.  WBC 12.8, down from 23 initially, remains afebrile.  No new complaints.  No associated rash or diarrhea.    Physical Exam: Constitutional:  Vitals:   08/20/18 2211 08/21/18 0513  BP: (!) 159/93 (!) 157/86  Pulse: 62 (!) 58  Resp: 18 18  Temp: 99.7 F (37.6 C) 98.4 F (36.9 C)  SpO2: 98% 97%   patient appears in NAD Respiratory: Normal respiratory effort; CTA B Cardiovascular: RRR GI: soft, nt, nd  Review of Systems: Constitutional: negative for fevers and chills Gastrointestinal: negative for diarrhea Integument/breast: negative for rash  Lab Results  Component Value Date   WBC 12.8 (H) 08/21/2018   HGB 10.8 (L) 08/21/2018   HCT 34.5 (L) 08/21/2018   MCV 87.1 08/21/2018   PLT 252 08/21/2018    Lab Results  Component Value Date   CREATININE 1.00 08/21/2018   BUN <5 (L) 08/21/2018   NA 138 08/21/2018   K 3.4 (L) 08/21/2018   CL 109 08/21/2018   CO2 23 08/21/2018    Lab Results  Component Value Date   ALT 16 08/18/2018   AST 25 08/18/2018   ALKPHOS 58 08/18/2018     Microbiology: Recent Results (from the past 240 hour(s))  Blood culture (routine x 2)     Status: None (Preliminary result)   Collection Time: 08/18/18  3:27 PM  Result Value Ref Range Status   Specimen Description BLOOD BLOOD LEFT HAND  Final   Special Requests   Final    BOTTLES DRAWN AEROBIC ONLY Blood Culture adequate volume   Culture   Final    NO GROWTH 2 DAYS Performed at [facility], [address], [place], [place] [address]    Report Status PENDING  Incomplete  Blood culture (routine x 2)     Status: Abnormal   Collection Time: 08/18/18  3:32 PM  Result Value Ref Range Status   Specimen Description BLOOD RIGHT ANTECUBITAL  Final   Special Requests   Final   BOTTLES DRAWN AEROBIC AND ANAEROBIC Blood Culture adequate volume   Culture  Setup Time   Final    GRAM POSITIVE COCCI ANAEROBIC BOTTLE ONLY CRITICAL RESULT CALLED TO, READ BACK BY AND VERIFIED WITH: PHARMD RACHEL R 1106  [id number] FCP Performed at [facility], [address], [place], [place] [address]    Culture STAPHYLOCOCCUS AUREUS (A)  Final   Report Status 08/21/2018 FINAL  Final   Organism ID, Bacteria STAPHYLOCOCCUS AUREUS  Final      Susceptibility   Staphylococcus aureus - MIC*    CIPROFLOXACIN >=8 RESISTANT Resistant     ERYTHROMYCIN >=8 RESISTANT Resistant     GENTAMICIN <=0.5 SENSITIVE Sensitive     OXACILLIN 0.5 SENSITIVE Sensitive     TETRACYCLINE <=1 SENSITIVE Sensitive     VANCOMYCIN <=0.5 SENSITIVE Sensitive     TRIMETH/SULFA <=10 SENSITIVE Sensitive     CLINDAMYCIN <=0.25 SENSITIVE Sensitive     RIFAMPIN <=0.5 SENSITIVE Sensitive     Inducible Clindamycin NEGATIVE Sensitive     * STAPHYLOCOCCUS AUREUS  Blood Culture ID Panel (Reflexed)     Status: Abnormal   Collection Time: 08/18/18  3:32 PM  Result Value Ref Range Status   Enterococcus species NOT DETECTED NOT DETECTED Final   Listeria monocytogenes  NOT DETECTED NOT DETECTED Final   Staphylococcus species DETECTED (A) NOT DETECTED Final    Comment: CRITICAL RESULT CALLED TO, READ BACK BY AND VERIFIED WITH: PHARMD RACHEL R 1106  [id number] FCP    Staphylococcus aureus (BCID) DETECTED (A) NOT DETECTED Final    Comment: Methicillin (oxacillin) susceptible Staphylococcus aureus (MSSA). Preferred therapy is anti staphylococcal beta lactam antibiotic (Cefazolin or Nafcillin), unless clinically contraindicated. CRITICAL RESULT CALLED TO, READ BACK BY AND VERIFIED WITH: PHARMD RACHEL R 1106  [id number] FCP    Methicillin resistance NOT DETECTED NOT DETECTED Final   Streptococcus species NOT DETECTED NOT DETECTED Final   Streptococcus agalactiae NOT DETECTED NOT DETECTED Final   Streptococcus pneumoniae NOT DETECTED  NOT DETECTED Final   Streptococcus pyogenes NOT DETECTED NOT DETECTED Final   Acinetobacter baumannii NOT DETECTED NOT DETECTED Final   Enterobacteriaceae species NOT DETECTED NOT DETECTED Final   Enterobacter cloacae complex NOT DETECTED NOT DETECTED Final   Escherichia coli NOT DETECTED NOT DETECTED Final   Klebsiella oxytoca NOT DETECTED NOT DETECTED Final   Klebsiella pneumoniae NOT DETECTED NOT DETECTED Final   Proteus species NOT DETECTED NOT DETECTED Final   Serratia marcescens NOT DETECTED NOT DETECTED Final   Haemophilus influenzae NOT DETECTED NOT DETECTED Final   Neisseria meningitidis NOT DETECTED NOT DETECTED Final   Pseudomonas aeruginosa NOT DETECTED NOT DETECTED Final   Candida albicans NOT DETECTED NOT DETECTED Final   Candida glabrata NOT DETECTED NOT DETECTED Final   Candida krusei NOT DETECTED NOT DETECTED Final   Candida parapsilosis NOT DETECTED NOT DETECTED Final   Candida tropicalis NOT DETECTED NOT DETECTED Final    Comment: Performed at [facility], [address] [place], [place] [address]  MRSA PCR Screening     Status: Abnormal   Collection Time: 08/19/18 12:41 AM  Result Value Ref Range Status   MRSA by PCR POSITIVE (A) NEGATIVE Final    Comment: RESULT CALLED TO, READ BACK BY AND VERIFIED WITH: RN T. Davene CostainBULLOCK [phone] @[phone]  THANEY        The GeneXpert MRSA Assay (FDA approved for NASAL specimens only), is one component of a comprehensive MRSA colonization surveillance program. It is not intended to diagnose MRSA infection nor to guide or monitor treatment for MRSA infections. Performed at [facility], [address] [place], [place] [address]     Impression/Plan:  1. Positive blood culture - though is Staph aureus, significance unclear.  His clinical picture is most c/w aspiration with his history.  TTE without any significant concerns on valves.   Does not appear to clinically fit his presentation and with just 1 bottle, it is c/w  contaminate. Would just treat as aspiration pneunmonia as in #2.      2.  aspiration pneumonia - on appropriate therapy and improved clinically Continue with appropriate treatment  3.  Leukocytosis - improved, c/w #2.    I will sign off, call  With questions.

## 2018-08-21 NOTE — Progress Notes (Signed)
Physical Therapy Treatment Patient Details Name: Cory Mullins MRN: [id number] DOB: 07/20/1981 Today's Date: 08/21/2018    History of Present Illness Cory Mullins  is a 37 y.o. male, with past medical history significant for blindness, hypertension and diabetes mellitus who presents today with altered mental status.  Found on floor by roomate with coffee ground emesis.  Admitted with aspiration pnuemonia and sepsis.     PT Comments    Moving well if given appropriate directional cues.  Sats in upper 90's on RA with gait.   Follow Up Recommendations  No PT follow up     Equipment Recommendations  None recommended by PT    Recommendations for Other Services       Precautions / Restrictions Precautions Precautions: Fall Precaution Comments: reports totally blind since age 41    Mobility  Bed Mobility Overal bed mobility: Modified Independent                Transfers Overall transfer level: Needs assistance   Transfers: Sit to/from Stand Sit to Stand: Min guard         General transfer comment: guard and for unfamiliarity of the environment  Ambulation/Gait Ambulation/Gait assistance: Min assist Gait Distance (Feet): 300 Feet Assistive device: IV Pole Gait Pattern/deviations: Step-through pattern   Gait velocity interpretation: <1.8 ft/sec, indicate of risk for recurrent falls General Gait Details: assist only through direction of the iv pole with directional cues.   Stairs             Wheelchair Mobility    Modified Rankin (Stroke Patients Only)       Balance     Sitting balance-Leahy Scale: Good       Standing balance-Leahy Scale: Fair                              Cognition Arousal/Alertness: Awake/alert Behavior During Therapy: WFL for tasks assessed/performed Overall Cognitive Status: Within Functional Limits for tasks assessed                                        Exercises      General  Comments General comments (skin integrity, edema, etc.): sats 98% on RA      Pertinent Vitals/Pain Pain Assessment: Faces    Home Living                      Prior Function            PT Goals (current goals can now be found in the care plan section) Acute Rehab PT Goals Patient Stated Goal: to get stronger PT Goal Formulation: With patient Time For Goal Achievement: 08/27/18 Potential to Achieve Goals: Good Progress towards PT goals: Progressing toward goals    Frequency    Min 3X/week      PT Plan Current plan remains appropriate    Co-evaluation              AM-PAC PT "6 Clicks" Daily Activity  Outcome Measure  Difficulty turning over in bed (including adjusting bedclothes, sheets and blankets)?: None Difficulty moving from lying on back to sitting on the side of the bed? : A Little Difficulty sitting down on and standing up from a chair with arms (e.g., wheelchair, bedside commode, etc,.)?: Unable Help needed moving to and from a bed to chair (  including a wheelchair)?: A Little Help needed walking in hospital room?: A Little Help needed climbing 3-5 steps with a railing? : A Little 6 Click Score: 17    End of Session   Activity Tolerance: Patient tolerated treatment well Patient left: with call bell/phone within reach;in chair;with chair alarm set Nurse Communication: Mobility status PT Visit Diagnosis: Other abnormalities of gait and mobility (R26.89);Difficulty in walking, not elsewhere classified (R26.2)     Time: 4098-1191 PT Time Calculation (min) (ACUTE ONLY): 18 min  Charges:  $Gait Training: 8-22 mins                     08/21/2018  [place] Bing, PT Acute Rehabilitation Services [phone]  (pager) [phone]  (office)   Eliseo Gum Eulice Rutledge 08/21/2018, 5:02 PM

## 2018-08-22 LAB — GLUCOSE, CAPILLARY
GLUCOSE-CAPILLARY: 162 mg/dL — AB (ref 70–99)
Glucose-Capillary: 111 mg/dL — ABNORMAL HIGH (ref 70–99)
Glucose-Capillary: 155 mg/dL — ABNORMAL HIGH (ref 70–99)

## 2018-08-22 MED ORDER — FOLIC ACID 1 MG PO TABS: 1.0000 mg | ORAL_TABLET | Freq: Every day | ORAL | 0 refills | Status: AC

## 2018-08-22 MED ORDER — AMOXICILLIN-POT CLAVULANATE 875-125 MG PO TABS: 1.0000 | ORAL_TABLET | Freq: Two times a day (BID) | ORAL | 0 refills | Status: AC

## 2018-08-22 MED ORDER — VITAMIN B-1 100 MG PO TABS: 100.0000 mg | ORAL_TABLET | Freq: Every day | ORAL | 0 refills | Status: AC

## 2018-08-22 NOTE — Discharge Instructions (Signed)
Follow with Primary MD Cory Mullins, Cory B, PA-C in 7 days   Get CBC, CMP, 2 view Chest X ray -  checked  by Primary MD  in 5-7 days    Activity: As tolerated with Full fall precautions use walker/cane & assistance as needed  Disposition Home    Diet: Heart Healthy - Low Carb, check CBGs QAC-HS  Special Instructions: If you have smoked or chewed Tobacco  in the last 2 yrs please stop smoking, stop any regular Alcohol  and or any Recreational drug use.  On your next visit with your primary care physician please Get Medicines reviewed and adjusted.  Please request your Prim.MD to go over all Hospital Tests and Procedure/Radiological results at the follow up, please get all Hospital records sent to your Prim MD by signing hospital release before you go home.  If you experience worsening of your admission symptoms, develop shortness of breath, life threatening emergency, suicidal or homicidal thoughts you must seek medical attention immediately by calling 911 or calling your MD immediately  if symptoms less severe.  You Must read complete instructions/literature along with all the possible adverse reactions/side effects for all the Medicines you take and that have been prescribed to you. Take any new Medicines after you have completely understood and accpet all the possible adverse reactions/side effects.                                                        Cory RossettiBrandon Mullins was admitted to the Hospital on 08/18/2018 and Discharged  08/22/2018 and should be excused from work/school   for 5  days starting from date -  08/18/2018 , may return to work/school without any restrictions.  Call Cory RaringPrashant Baruc Tugwell MD, [facility]  [phone] with questions.  Cory Mullins M.D on 08/22/2018,at 8:18 AM  [facility]   Office  [phone]

## 2018-08-22 NOTE — Discharge Summary (Addendum)
Cory RossettiBrandon Nabozny WGN:[id number] DOB: 08-29-1981 DOA: 08/18/2018  PCP: Jordan HawksGordon, Sarah B, PA-C  Admit date: 08/18/2018  Discharge date: 08/22/2018  Admitted From: Home   Disposition:  Home   Recommendations for Outpatient Follow-up:   Follow up with PCP in 1-2 weeks  PCP Please obtain BMP/CBC, 2 view CXR in 1week,  (see Discharge instructions)   PCP Please follow up on the following pending results:    Home Health: None   Equipment/Devices: None  Consultations: ID Discharge Condition: Stable   CODE STATUS: Full   Diet Recommendation: Heart Healthy Low Carb  Chief Complaint  Patient presents with  . GI Bleeding     Brief history of present illness from the day of admission and additional interim summary    BrandonMurphyis a37 y.o.male,with past medical history significant for blindness, hypertension and diabetes mellitus who presents today with altered mental status.As per his roommate that the patient has been falling a lot in the last few days and has been drinking more alcohol than usual. He was found today on the floor with coffee-ground emesis, unresponsive. His blood sugar on arrival was 172 and his pulse ox was 86% on room air. The patient was noted to be hypothermic. In the emergency room CT of the head was negative but his chest x-ray showed right lower lobe pneumonia and his white blood cell count was elevated. His troponins were slightly elevated and he was in acute renal failure. Patient is admitted for aspiration pneumonia with sepsis.                                                                 Hospital Course   1.  Sepsis due to aspiration pneumonia.  1 out of 2 blood cultures growing MSSA question of contamination, clinically this appears to be aspiration pneumonia due to emesis after  alcoholic binge, responded very well clinically to Unasyn which will be continued.  Sepsis pathophysiology has resolved. Repeat blood cultures drawn on 08/20/2018 are negative to date ( 48hrs), point clinically infection is much improved he will be placed on 10 more days of oral Augmentin and discharged clinically it appears his bacteremia was a contamination although he did have aspiration pneumonia.  2.  Nausea and dark emesis caused after alcohol binge.    Likely small gastritis versus Mallory-Weiss tear completely resolved, symptom-free no nausea or abdominal pain, H&H remained stable.  3.  Alcohol abuse.  Counseled to quit.  No signs of DTs will get folic acid and thiamine upon discharge.  4.  Legal blindness.  Supportive care.  5.  Hypokalemia.  Replace and repeat to recheck next visit.    6.  Mild troponin elevation and non-ACS pattern.  Trend is flat, EKG is nonacute with nonspecific changes, cannot use aspirin due to possible bloody emesis  as in #2 above, no beta-blocker currently due to borderline low blood pressures, stable TTE to evaluate EF and wall motion.  7. DM type II in poor outpatient control. No acute issues resume home regimen upon discharge.  PCP to monitor.  Lab Results  Component Value Date   HGBA1C 11.5 (H) 12/29/2017     Discharge diagnosis     Active Problems:   Blindness   Sepsis (HCC)   Aspiration pneumonia (HCC)   Alcohol abuse with intoxication [facility])    Discharge instructions    Discharge Instructions    Diet - low sodium heart healthy   Complete by:  As directed    Discharge instructions   Complete by:  As directed    Follow with Primary MD Jordan Hawks, PA-C in 7 days   Get CBC, CMP, 2 view Chest X ray -  checked  by Primary MD  in 5-7 days    Activity: As tolerated with Full fall precautions use walker/cane & assistance as needed  Disposition Home    Diet: Heart Healthy  Low Carb, check CBGs QAC-HS  Special Instructions: If  you have smoked or chewed Tobacco  in the last 2 yrs please stop smoking, stop any regular Alcohol  and or any Recreational drug use.  On your next visit with your primary care physician please Get Medicines reviewed and adjusted.  Please request your Prim.MD to go over all Hospital Tests and Procedure/Radiological results at the follow up, please get all Hospital records sent to your Prim MD by signing hospital release before you go home.  If you experience worsening of your admission symptoms, develop shortness of breath, life threatening emergency, suicidal or homicidal thoughts you must seek medical attention immediately by calling 911 or calling your MD immediately  if symptoms less severe.  You Must read complete instructions/literature along with all the possible adverse reactions/side effects for all the Medicines you take and that have been prescribed to you. Take any new Medicines after you have completely understood and accpet all the possible adverse reactions/side effects.                                                        Drequan Ironside was admitted to the Hospital on 08/18/2018 and Discharged  08/22/2018 and should be excused from work/school   for 5  days starting from date -  08/18/2018 , may return to work/school without any restrictions.  Call Susa Raring MD, [facility]  [phone] with questions.  Susa Raring M.D on 08/22/2018,at 8:18 AM  [facility]   Office  [phone]   Increase activity slowly   Complete by:  As directed       Discharge Medications   Allergies as of 08/22/2018      Reactions   Adhesive [tape] Other (See Comments)   Has eczema and his skin is sensitive, per mom   Fruit & Vegetable Daily [nutritional Supplements] Hives, Other (See Comments)   Pears      Medication List    TAKE these medications   amoxicillin-clavulanate 875-125 MG tablet Commonly known as:  AUGMENTIN Take 1 tablet by mouth 2 (two) times  daily for 7 days.   atorvastatin 10 MG tablet Commonly known as:  LIPITOR Take 10 mg by mouth daily.   folic acid  1 MG tablet Commonly known as:  FOLVITE Take 1 tablet (1 mg total) by mouth daily.   gabapentin 100 MG capsule Commonly known as:  NEURONTIN Take 100 mg by mouth 3 (three) times daily.   hydrochlorothiazide 25 MG tablet Commonly known as:  HYDRODIURIL Take 25 mg by mouth every morning.   insulin glargine 100 UNIT/ML injection Commonly known as:  LANTUS Inject 0.65 mLs (65 Units total) into the skin at bedtime.   lisinopril 10 MG tablet Commonly known as:  PRINIVIL,ZESTRIL Take 10 mg by mouth every morning.   Semaglutide(0.25 or 0.5MG /DOS) 2 MG/1.5ML Sopn Inject 0.5 mg into the skin once a week.   SYNJARDY XR 02-999 MG Tb24 Generic drug:  Empagliflozin-metFORMIN HCl ER Take 1 tablet by mouth 2 (two) times daily.   thiamine 100 MG tablet Commonly known as:  VITAMIN B-1 Take 1 tablet (100 mg total) by mouth daily.       Follow-up Information    Jordan Hawks, PA-C. Schedule an appointment as soon as possible for a visit in 1 week(s).   Specialty:  Physician Assistant Contact information: [address] [place] [address] [phone]           Major procedures and Radiology Reports - PLEASE review detailed and final reports thoroughly  -      TTE - Technically difficult study with poor acoustic windows. Normal LVsize with EF 60-65%, normal diastolic function. The RV was poorlyvisualized. No definite valvular abnormalities were noted.   Ct Head Wo Contrast  Result Date: 08/18/2018 CLINICAL DATA:  Altered level of consciousness. EXAM: CT HEAD WITHOUT CONTRAST TECHNIQUE: Contiguous axial images were obtained from the base of the skull through the vertex without intravenous contrast. COMPARISON:  None. FINDINGS: Brain: No evidence of acute infarction, hemorrhage, hydrocephalus, extra-axial collection or mass lesion/mass effect. Vascular:  No hyperdense vessel or unexpected calcification. Skull: Normal. Negative for fracture or focal lesion. Sinuses/Orbits: No acute finding. Other: None. IMPRESSION: Normal head CT. Electronically Signed   By: Lupita Raider, M.D.   On: 08/18/2018 16:52   Dg Chest Port 1 View  Result Date: 08/18/2018 CLINICAL DATA:  Hypoxia. EXAM: PORTABLE CHEST 1 VIEW COMPARISON:  Radiographs of July 17, 2018. FINDINGS: The heart size and mediastinal contours are within normal limits. No pneumothorax or pleural effusion is noted. Left lung is clear. New right midlung and basilar opacity is noted concerning for possible pneumonia. The visualized skeletal structures are unremarkable. IMPRESSION: New right lung opacity is noted concerning for pneumonia. Electronically Signed   By: Lupita Raider, M.D.   On: 08/18/2018 16:23   Dg Shoulder Right Port  Result Date: 08/18/2018 CLINICAL DATA:  Right shoulder pain.  Unresponsive patient. EXAM: PORTABLE RIGHT SHOULDER COMPARISON:  None. FINDINGS: Single AP view of the shoulder demonstrates no grossly displaced fracture. Visualized right hemithorax is unremarkable. IMPRESSION: No grossly displaced fracture of the right shoulder. Recommend dedicated views of the shoulder when patient clinically able to exclude the possibility of a subtle fracture or dislocation. Electronically Signed   By: Annia Belt M.D.   On: 08/18/2018 18:01   Dg Abd Portable 1v  Result Date: 08/19/2018 CLINICAL DATA:  Nausea vomiting. EXAM: PORTABLE ABDOMEN - 1 VIEW COMPARISON:  CT, 08/18/2018 FINDINGS: Normal bowel gas pattern. Mild colonic stool burden stable from the previous day's CT. No evidence of renal or ureteral stones. Soft tissues are unremarkable. No skeletal abnormality. IMPRESSION: 1. No acute findings.  No bowel obstruction. 2. Mild colonic  stool burden. Electronically Signed   By: Amie Portland M.D.   On: 08/19/2018 11:09   Ct Renal Stone Study  Result Date: 08/18/2018 CLINICAL DATA:   Flank pain, vomiting, acute renal failure. Coffee ground emesis since this morning. EXAM: CT ABDOMEN AND PELVIS WITHOUT CONTRAST TECHNIQUE: Multidetector CT imaging of the abdomen and pelvis was performed following the standard protocol without IV contrast. COMPARISON:  None. FINDINGS: Lower chest: Patchy dense consolidations within the RIGHT lower lobe, incompletely imaged. Hepatobiliary: Streak artifact limits characterization of the liver and gallbladder. However, there is no focal mass or lesion identified within the liver and no secondary signs of acute cholecystitis. No evidence of bile duct dilatation. Pancreas: Unremarkable. No pancreatic ductal dilatation or surrounding inflammatory changes. Spleen: Normal in size without focal abnormality. Adrenals/Urinary Tract: Adrenal glands appear normal. Kidneys are unremarkable without mass, stone or hydronephrosis. No ureteral or bladder calculi identified. Bladder appears normal. Stomach/Bowel: No dilated large or small bowel loops. No evidence of bowel wall inflammation. Appendix is normal. Stomach is unremarkable. Vascular/Lymphatic: No significant vascular findings are present. No enlarged abdominal or pelvic lymph nodes. Reproductive: Prostate is unremarkable. Other: No free fluid or abscess collection. No free intraperitoneal air. Musculoskeletal: No acute or suspicious osseous finding. IMPRESSION: 1. Patchy dense consolidations within the RIGHT lower lung, incompletely imaged, compatible with pneumonia or aspiration. 2. No acute findings within the abdomen or pelvis, with mild study limitations detailed above. No bowel obstruction or evidence of bowel wall inflammation seen. No evidence of acute solid organ abnormality. No renal or ureteral calculi. No free fluid or abscess. Appendix is normal. Electronically Signed   By: Bary Richard M.D.   On: 08/18/2018 17:37    Micro Results    Recent Results (from the past 240 hour(s))  Blood culture (routine x  2)     Status: None (Preliminary result)   Collection Time: 08/18/18  3:27 PM  Result Value Ref Range Status   Specimen Description BLOOD BLOOD LEFT HAND  Final   Special Requests   Final    BOTTLES DRAWN AEROBIC ONLY Blood Culture adequate volume   Culture   Final    NO GROWTH 3 DAYS Performed at [facility], [address], [place], [place] [address]    Report Status PENDING  Incomplete  Blood culture (routine x 2)     Status: Abnormal   Collection Time: 08/18/18  3:32 PM  Result Value Ref Range Status   Specimen Description BLOOD RIGHT ANTECUBITAL  Final   Special Requests   Final    BOTTLES DRAWN AEROBIC AND ANAEROBIC Blood Culture adequate volume   Culture  Setup Time   Final    GRAM POSITIVE COCCI ANAEROBIC BOTTLE ONLY CRITICAL RESULT CALLED TO, READ BACK BY AND VERIFIED WITH: PHARMD RACHEL R [id number]  [id number] FCP Performed at [facility], [address] [place], [place] [address]    Culture STAPHYLOCOCCUS AUREUS (A)  Final   Report Status 08/21/2018 FINAL  Final   Organism ID, Bacteria STAPHYLOCOCCUS AUREUS  Final      Susceptibility   Staphylococcus aureus - MIC*    CIPROFLOXACIN >=8 RESISTANT Resistant     ERYTHROMYCIN >=8 RESISTANT Resistant     GENTAMICIN <=0.5 SENSITIVE Sensitive     OXACILLIN 0.5 SENSITIVE Sensitive     TETRACYCLINE <=1 SENSITIVE Sensitive     VANCOMYCIN <=0.5 SENSITIVE Sensitive     TRIMETH/SULFA <=10 SENSITIVE Sensitive     CLINDAMYCIN <=0.25 SENSITIVE Sensitive  RIFAMPIN <=0.5 SENSITIVE Sensitive     Inducible Clindamycin NEGATIVE Sensitive     * STAPHYLOCOCCUS AUREUS  Blood Culture ID Panel (Reflexed)     Status: Abnormal   Collection Time: 08/18/18  3:32 PM  Result Value Ref Range Status   Enterococcus species NOT DETECTED NOT DETECTED Final   Listeria monocytogenes NOT DETECTED NOT DETECTED Final   Staphylococcus species DETECTED (A) NOT DETECTED Final    Comment: CRITICAL RESULT CALLED TO, READ BACK BY AND VERIFIED  WITH: PHARMD RACHEL R 1106  [id number] FCP    Staphylococcus aureus (BCID) DETECTED (A) NOT DETECTED Final    Comment: Methicillin (oxacillin) susceptible Staphylococcus aureus (MSSA). Preferred therapy is anti staphylococcal beta lactam antibiotic (Cefazolin or Nafcillin), unless clinically contraindicated. CRITICAL RESULT CALLED TO, READ BACK BY AND VERIFIED WITH: PHARMD RACHEL R 1106  111019 FCP    Methicillin resistance NOT DETECTED NOT DETECTED Final   Streptococcus species NOT DETECTED NOT DETECTED Final   Streptococcus agalactiae NOT DETECTED NOT DETECTED Final   Streptococcus pneumoniae NOT DETECTED NOT DETECTED Final   Streptococcus pyogenes NOT DETECTED NOT DETECTED Final   Acinetobacter baumannii NOT DETECTED NOT DETECTED Final   Enterobacteriaceae species NOT DETECTED NOT DETECTED Final   Enterobacter cloacae complex NOT DETECTED NOT DETECTED Final   Escherichia coli NOT DETECTED NOT DETECTED Final   Klebsiella oxytoca NOT DETECTED NOT DETECTED Final   Klebsiella pneumoniae NOT DETECTED NOT DETECTED Final   Proteus species NOT DETECTED NOT DETECTED Final   Serratia marcescens NOT DETECTED NOT DETECTED Final   Haemophilus influenzae NOT DETECTED NOT DETECTED Final   Neisseria meningitidis NOT DETECTED NOT DETECTED Final   Pseudomonas aeruginosa NOT DETECTED NOT DETECTED Final   Candida albicans NOT DETECTED NOT DETECTED Final   Candida glabrata NOT DETECTED NOT DETECTED Final   Candida krusei NOT DETECTED NOT DETECTED Final   Candida parapsilosis NOT DETECTED NOT DETECTED Final   Candida tropicalis NOT DETECTED NOT DETECTED Final    Comment: Performed at [facility], [address] [place], [place] [address]  MRSA PCR Screening     Status: Abnormal   Collection Time: 08/19/18 12:41 AM  Result Value Ref Range Status   MRSA by PCR POSITIVE (A) NEGATIVE Final    Comment: RESULT CALLED TO, READ BACK BY AND VERIFIED WITH: RN T. Davene Costain [phone] @0524  THANEY        The  GeneXpert MRSA Assay (FDA approved for NASAL specimens only), is one component of a comprehensive MRSA colonization surveillance program. It is not intended to diagnose MRSA infection nor to guide or monitor treatment for MRSA infections. Performed at [facility], [address], [place], [place] [address]   Culture, blood (routine x 2)     Status: None (Preliminary result)   Collection Time: 08/20/18 10:15 AM  Result Value Ref Range Status   Specimen Description BLOOD RIGHT ANTECUBITAL  Final   Special Requests   Final    BOTTLES DRAWN AEROBIC ONLY Blood Culture adequate volume   Culture   Final    NO GROWTH 1 DAY Performed at [facility], [address] [place], [place] [address]    Report Status PENDING  Incomplete  Culture, blood (routine x 2)     Status: None (Preliminary result)   Collection Time: 08/20/18 10:30 AM  Result Value Ref Range Status   Specimen Description BLOOD LEFT HAND  Final   Special Requests   Final    BOTTLES DRAWN  AEROBIC ONLY Blood Culture adequate volume   Culture   Final    NO GROWTH 1 DAY Performed at [facility], [address] [place], [place] [address]    Report Status PENDING  Incomplete    Today   Subjective    Donovyn Guidice today has no headache,no chest abdominal pain,no new weakness tingling or numbness, feels much better wants to go home today.    Objective   Blood pressure (!) 147/86, pulse 62, temperature 98 F (36.7 C), temperature source Oral, resp. rate 20, weight 104.3 kg, SpO2 100 %.   Intake/Output Summary (Last 24 hours) at 08/22/2018 0820 Last data filed at 08/22/2018 0723 Gross per 24 hour  Intake 240 ml  Output 2500 ml  Net -2260 ml    Exam Awake Alert, Oriented x 3, No new F.N deficits, Normal affect [place].AT,PERRAL Supple Neck,No JVD, No cervical lymphadenopathy appriciated.  Symmetrical Chest wall movement, Good air movement bilaterally, CTAB RRR,No Gallops,Rubs or new Murmurs, No  Parasternal Heave +ve B.Sounds, Abd Soft, Non tender, No organomegaly appriciated, No rebound -guarding or rigidity. No Cyanosis, Clubbing or edema, No new Rash or bruise   Data Review   CBC w Diff:  Lab Results  Component Value Date   WBC 12.8 (H) 08/21/2018   HGB 10.8 (L) 08/21/2018   HCT 34.5 (L) 08/21/2018   PLT 252 08/21/2018   LYMPHOPCT 3 08/18/2018   MONOPCT 7 08/18/2018   EOSPCT 0 08/18/2018   BASOPCT 0 08/18/2018    CMP:  Lab Results  Component Value Date   NA 138 08/21/2018   K 3.4 (L) 08/21/2018   CL 109 08/21/2018   CO2 23 08/21/2018   BUN <5 (L) 08/21/2018   CREATININE 1.00 08/21/2018   PROT 7.8 08/18/2018   ALBUMIN 3.6 08/18/2018   BILITOT 0.4 08/18/2018   ALKPHOS 58 08/18/2018   AST 25 08/18/2018   ALT 16 08/18/2018  .   Total Time in preparing paper work, data evaluation and todays exam - 35 minutes  Susa Raring M.D on 08/22/2018 at 8:20 AM  [facility]   Office  [phone]

## 2018-08-22 NOTE — Progress Notes (Signed)
Discharge teaching was done, IV is out, prescriptions were handed to patient, and now he is waiting on his ride.

## 2018-08-22 NOTE — Progress Notes (Signed)
Cory RossettiBrandon Mullins to be D/C'd Home per MD order.  Discussed with the patient and all questions fully answered.  VSS, Skin clean, dry and intact without evidence of skin break down, no evidence of skin tears noted. IV catheter discontinued intact. Site without signs and symptoms of complications. Dressing and pressure applied.  An After Visit Summary was printed and given to the patient. Patient received prescription.  D/c education completed with patient/family including follow up instructions, medication list, d/c activities limitations if indicated, with other d/c instructions as indicated by MD - patient able to verbalize understanding, all questions fully answered.   Patient instructed to return to ED, call 911, or call MD for any changes in condition.   Patient escorted via WC, and D/C home via private auto.  Marca AnconaLaura M Malina Geers 08/22/2018 5:01 PM

## 2018-08-22 NOTE — Progress Notes (Signed)
Patient still waiting on ride.

## 2018-08-23 LAB — CULTURE, BLOOD (ROUTINE X 2)
CULTURE: NO GROWTH
SPECIAL REQUESTS: ADEQUATE

## 2018-08-25 LAB — CULTURE, BLOOD (ROUTINE X 2)
CULTURE: NO GROWTH
Culture: NO GROWTH
SPECIAL REQUESTS: ADEQUATE
Special Requests: ADEQUATE

## 2018-09-29 ENCOUNTER — Other Ambulatory Visit: Payer: Self-pay

## 2018-09-29 ENCOUNTER — Encounter (HOSPITAL_COMMUNITY): Payer: Self-pay

## 2018-09-29 ENCOUNTER — Emergency Department (HOSPITAL_COMMUNITY): Payer: BLUE CROSS/BLUE SHIELD

## 2018-09-29 ENCOUNTER — Emergency Department (HOSPITAL_COMMUNITY)
Admission: EM | Admit: 2018-09-29 | Discharge: 2018-09-30 | Disposition: A | Discharge status: Home / Self Care | Payer: BLUE CROSS/BLUE SHIELD | Attending: Emergency Medicine | Admitting: Emergency Medicine

## 2018-09-29 DIAGNOSIS — R109 Unspecified abdominal pain: Secondary | ICD-10-CM | POA: Insufficient documentation

## 2018-09-29 DIAGNOSIS — E119 Type 2 diabetes mellitus without complications: Secondary | ICD-10-CM | POA: Insufficient documentation

## 2018-09-29 DIAGNOSIS — R112 Nausea with vomiting, unspecified: Secondary | ICD-10-CM | POA: Diagnosis present

## 2018-09-29 DIAGNOSIS — Z794 Long term (current) use of insulin: Secondary | ICD-10-CM | POA: Diagnosis not present

## 2018-09-29 DIAGNOSIS — I10 Essential (primary) hypertension: Secondary | ICD-10-CM | POA: Diagnosis not present

## 2018-09-29 DIAGNOSIS — R197 Diarrhea, unspecified: Secondary | ICD-10-CM | POA: Diagnosis not present

## 2018-09-29 DIAGNOSIS — Z79899 Other long term (current) drug therapy: Secondary | ICD-10-CM | POA: Diagnosis not present

## 2018-09-29 DIAGNOSIS — R1084 Generalized abdominal pain: Secondary | ICD-10-CM

## 2018-09-29 LAB — COMPREHENSIVE METABOLIC PANEL
ALT: 10 U/L (ref 0–44)
ANION GAP: 11 (ref 5–15)
AST: 13 U/L — ABNORMAL LOW (ref 15–41)
Albumin: 4.4 g/dL (ref 3.5–5.0)
Alkaline Phosphatase: 54 U/L (ref 38–126)
BUN: 21 mg/dL — ABNORMAL HIGH (ref 6–20)
CHLORIDE: 108 mmol/L (ref 98–111)
CO2: 21 mmol/L — ABNORMAL LOW (ref 22–32)
Calcium: 10.3 mg/dL (ref 8.9–10.3)
Creatinine, Ser: 1.09 mg/dL (ref 0.61–1.24)
Glucose, Bld: 133 mg/dL — ABNORMAL HIGH (ref 70–99)
POTASSIUM: 3.8 mmol/L (ref 3.5–5.1)
Sodium: 140 mmol/L (ref 135–145)
Total Bilirubin: 1 mg/dL (ref 0.3–1.2)
Total Protein: 8.1 g/dL (ref 6.5–8.1)

## 2018-09-29 LAB — CBC WITH DIFFERENTIAL/PLATELET
ABS IMMATURE GRANULOCYTES: 0.06 10*3/uL (ref 0.00–0.07)
BASOS ABS: 0 10*3/uL (ref 0.0–0.1)
BASOS PCT: 0 %
Eosinophils Absolute: 0 10*3/uL (ref 0.0–0.5)
Eosinophils Relative: 0 %
HCT: 45.7 % (ref 39.0–52.0)
HEMOGLOBIN: 14.4 g/dL (ref 13.0–17.0)
Immature Granulocytes: 0 %
LYMPHS PCT: 7 %
Lymphs Abs: 1.1 10*3/uL (ref 0.7–4.0)
MCH: 28.4 pg (ref 26.0–34.0)
MCHC: 31.5 g/dL (ref 30.0–36.0)
MCV: 90.1 fL (ref 80.0–100.0)
Monocytes Absolute: 0.2 10*3/uL (ref 0.1–1.0)
Monocytes Relative: 2 %
NEUTROS ABS: 14.1 10*3/uL — AB (ref 1.7–7.7)
NRBC: 0 % (ref 0.0–0.2)
Neutrophils Relative %: 91 %
PLATELETS: 289 10*3/uL (ref 150–400)
RBC: 5.07 MIL/uL (ref 4.22–5.81)
RDW: 13.4 % (ref 11.5–15.5)
WBC: 15.4 10*3/uL — AB (ref 4.0–10.5)

## 2018-09-29 LAB — LIPASE, BLOOD: LIPASE: 31 U/L (ref 11–51)

## 2018-09-29 MED ORDER — IOPAMIDOL (ISOVUE-300) INJECTION 61%
INTRAVENOUS | Status: AC
  Filled 2018-09-29: qty 100

## 2018-09-29 MED ORDER — SODIUM CHLORIDE (PF) 0.9 % IJ SOLN
INTRAMUSCULAR | Status: AC
  Filled 2018-09-29: qty 50

## 2018-09-29 MED ORDER — SODIUM CHLORIDE 0.9 % IV BOLUS
1000.0000 mL | Freq: Once | INTRAVENOUS | Status: AC
  Administered 2018-09-29: 1000 mL via INTRAVENOUS

## 2018-09-29 MED ORDER — IOPAMIDOL (ISOVUE-300) INJECTION 61%
100.0000 mL | Freq: Once | INTRAVENOUS | Status: AC | PRN
  Administered 2018-09-29: 100 mL via INTRAVENOUS

## 2018-09-29 MED ORDER — ONDANSETRON 4 MG PO TBDP: 4.0000 mg | ORAL_TABLET | Freq: Three times a day (TID) | ORAL | 0 refills | Status: AC | PRN

## 2018-09-29 MED ORDER — ONDANSETRON HCL 4 MG/2ML IJ SOLN
4.0000 mg | Freq: Once | INTRAMUSCULAR | Status: AC
  Administered 2018-09-29: 4 mg via INTRAVENOUS
  Filled 2018-09-29: qty 2

## 2018-09-29 MED ORDER — MORPHINE SULFATE (PF) 4 MG/ML IV SOLN
4.0000 mg | Freq: Once | INTRAVENOUS | Status: AC
  Administered 2018-09-29: 4 mg via INTRAVENOUS
  Filled 2018-09-29: qty 1

## 2018-09-29 NOTE — ED Triage Notes (Signed)
Triage assessment needs to be completed after EDP completes her exam.

## 2018-09-29 NOTE — ED Provider Notes (Signed)
Patient signed out to me at shift change.  Patient with nausea, vomiting, diarrhea, generalized abdominal pain.  Patient waiting for CT of abdomen to rule out colitis, diverticulitis, or other acute process.  CT is reassuring.  Laboratory work-up reassuring.  Will discharge patient with Zofran.  Recommend PCP follow-up.  Return precautions given.  Patient is stable and ready for discharge.   Roxy HorsemanBrowning, Andersyn Fragoso, PA-C 09/29/18 [id number]    Rolan BuccoBelfi, Melanie, MD 09/29/18 [phone]

## 2018-09-29 NOTE — ED Provider Notes (Signed)
[facility]-EMERGENCY DEPT Provider Note   CSN: [id number] Arrival date & time: 09/29/18  2040     History   Chief Complaint Chief Complaint  Patient presents with  . Nausea  . Emesis  . Diarrhea    HPI Cory Mullins is a 37 y.o. male with a PMHx of DM2, anemia, HTN, and blindness, who presents to the ED with complaints of nausea, vomiting, diarrhea, and abdominal pain that began around 4 AM today.  He reports having 4-5 episodes of vomiting and diarrhea today, he is blind so he cannot report whether there was any blood in either of them.  He describes his abdominal pain is 4/10 intermittent dull epigastric pain that radiates to his lower abdomen, worse with eating, and with no treatments tried prior to arrival.  He states that yesterday he ate sausage balls, apple dump cake, chicken tenders, potato wedges, and lemon meringue pie with a Sierra mist.  He denies any fevers, chills, CP, S OB, constipation, obstipation, dysuria, myalgias, arthralgias, numbness, tingling, focal weakness, or any other complaints at this time.  He denies any recent travel, sick contacts, alcohol use, NSAID use, recent antibiotics, or prior abdominal surgeries.  The history is provided by the patient and medical records. No language interpreter was used.  Emesis   Associated symptoms include abdominal pain and diarrhea. Pertinent negatives include no arthralgias, no chills, no fever and no myalgias.  Diarrhea   Associated symptoms include abdominal pain and vomiting. Pertinent negatives include no chills, no arthralgias and no myalgias.    Past Medical History:  Diagnosis Date  . Blind   . Diabetes mellitus without complication (HCC)   . Hypertension     Patient Active Problem List   Diagnosis Date Noted  . Aspiration pneumonia (HCC) 08/19/2018  . Alcohol abuse with intoxication (HCC) 08/19/2018  . Sepsis (HCC) 08/18/2018  . Severe sepsis (HCC)   . Cellulitis and abscess of  right lower extremity 12/29/2017  . Uncontrolled type 2 diabetes mellitus with hyperglycemia (HCC) 12/29/2017  . Blindness 12/29/2017  . Normocytic normochromic anemia 12/29/2017  . Cellulitis and abscess of right leg 12/29/2017    Past Surgical History:  Procedure Laterality Date  . EYE SURGERY          Home Medications    Prior to Admission medications   Medication Sig Start Date End Date Taking? Authorizing Provider  atorvastatin (LIPITOR) 10 MG tablet Take 10 mg by mouth daily. 05/05/18   [provider]  folic acid (FOLVITE) 1 MG tablet Take 1 tablet (1 mg total) by mouth daily. 08/22/18   Leroy Sea, MD  gabapentin (NEURONTIN) 100 MG capsule Take 100 mg by mouth 3 (three) times daily. 07/16/18   [provider]  hydrochlorothiazide (HYDRODIURIL) 25 MG tablet Take 25 mg by mouth every morning. 05/14/18   [provider]  insulin glargine (LANTUS) 100 UNIT/ML injection Inject 0.65 mLs (65 Units total) into the skin at bedtime. Patient not taking: Reported on 08/19/2018 12/24/17   Michela Pitcher A, PA-C  lisinopril (PRINIVIL,ZESTRIL) 10 MG tablet Take 10 mg by mouth every morning. 04/25/18   [provider]  Semaglutide,0.25 or 0.5MG /DOS, 2 MG/1.5ML SOPN Inject 0.5 mg into the skin once a week.  07/02/18   [provider]  SYNJARDY XR 02-999 MG TB24 Take 1 tablet by mouth 2 (two) times daily.  07/02/18   [provider]  thiamine (VITAMIN B-1) 100 MG tablet Take 1 tablet (100 mg total)  by mouth daily. 08/22/18   Leroy SeaSingh, Prashant K, MD    Family History Family History  Problem Relation Age of Onset  . Diabetes Mellitus II Father   . Diabetes Father   . Hypertension Father   . Cancer Mother     Social History Social History   Tobacco Use  . Smoking status: Never Smoker  . Smokeless tobacco: Never Used  Substance Use Topics  . Alcohol use: Yes    Frequency: Never    Comment: occ  . Drug use: No     Allergies     Adhesive [tape] and Fruit & vegetable daily [nutritional supplements]   Review of Systems Review of Systems  Constitutional: Negative for chills and fever.  Respiratory: Negative for shortness of breath.   Cardiovascular: Negative for chest pain.  Gastrointestinal: Positive for abdominal pain, diarrhea, nausea and vomiting. Negative for constipation.  Genitourinary: Negative for dysuria.  Musculoskeletal: Negative for arthralgias and myalgias.  Allergic/Immunologic: Positive for immunocompromised state (DM2).  Neurological: Negative for weakness and numbness.  Psychiatric/Behavioral: Negative for confusion.   All other systems reviewed and are negative for acute change except as noted in the HPI.    Physical Exam Updated Vital Signs BP (!) 150/75 (BP Location: Left Arm)   Pulse 71   Temp 98.4 F (36.9 C) (Oral)   Resp 18   SpO2 99%   Physical Exam Vitals signs and nursing note reviewed.  Constitutional:      General: He is not in acute distress.    Appearance: Normal appearance. He is well-developed. He is not toxic-appearing.     Comments: Afebrile, nontoxic, NAD although appears to feel unwell, hiccupping throughout entire exam  HENT:     Head: Normocephalic and atraumatic.     Mouth/Throat:     Mouth: Mucous membranes are dry.     Comments: Dry lips and mucous membranes Eyes:     General:        Right eye: No discharge.        Left eye: No discharge.     Conjunctiva/sclera: Conjunctivae normal.  Neck:     Musculoskeletal: Normal range of motion and neck supple.  Cardiovascular:     Rate and Rhythm: Normal rate and regular rhythm.     Heart sounds: Normal heart sounds, S1 normal and S2 normal. No murmur. No friction rub. No gallop.   Pulmonary:     Effort: Pulmonary effort is normal. No respiratory distress.     Breath sounds: Normal breath sounds. No decreased breath sounds, wheezing, rhonchi or rales.  Abdominal:     General: Bowel sounds are normal. There is  no distension.     Palpations: Abdomen is soft. Abdomen is not rigid.     Tenderness: There is generalized abdominal tenderness. There is guarding. There is no right CVA tenderness, left CVA tenderness or rebound. Negative signs include Mcdougle's sign and McBurney's sign.     Comments: Soft, nondistended, +BS throughout, with mild generalized abd TTP throughout entire abdomen but mostly in the upper abdomen and LLQ, slight guarding with palpation of the LLQ, no rebound/rigidity, neg Fryman's, neg mcburney's, no CVA TTP   Musculoskeletal: Normal range of motion.  Skin:    General: Skin is warm and dry.     Findings: No rash.  Neurological:     Mental Status: He is alert and oriented to person, place, and time.     Sensory: Sensation is intact. No sensory deficit.     Motor:  Motor function is intact.  Psychiatric:        Mood and Affect: Mood and affect normal.        Behavior: Behavior normal.      ED Treatments / Results  Labs (all labs ordered are listed, but only abnormal results are displayed) Labs Reviewed  CBC WITH DIFFERENTIAL/PLATELET - Abnormal; Notable for the following components:      Result Value   WBC 15.4 (*)    Neutro Abs 14.1 (*)    All other components within normal limits  LIPASE, BLOOD  COMPREHENSIVE METABOLIC PANEL  URINALYSIS, ROUTINE W REFLEX MICROSCOPIC    EKG None  Radiology No results found.  Procedures Procedures (including critical care time)  Medications Ordered in ED Medications  sodium chloride 0.9 % bolus 1,000 mL (1,000 mLs Intravenous New Bag/Given 09/29/18 2222)  ondansetron (ZOFRAN) injection 4 mg (4 mg Intravenous Given 09/29/18 2224)  morphine 4 MG/ML injection 4 mg (4 mg Intravenous Given 09/29/18 2224)     Initial Impression / Assessment and Plan / ED Course  I have reviewed the triage vital signs and the nursing notes.  Pertinent labs & imaging results that were available during my care of the patient were reviewed by me and  considered in my medical decision making (see chart for details).     37 y.o. male here with n/v/d/abd pain beginning around 4am. On exam, appears dry, hiccupping repeatedly, with diffuse abd TTP with slight guarding but no rebound or rigidity. Will get labs and CT abd/pelv and give fluids/pain meds/nausea meds then reassess shortly.   10:49 PM CBC w/diff with WBC 15.4. Remainder of work up pending. Patient care to be resumed by Ivar Drapeob Browning, PA-C at shift change sign-out. Patient history has been discussed with provider resuming care. Please see their notes for further documentation of pending results and dispo/care. Pt stable at sign-out and updated on transfer of care.    Final Clinical Impressions(s) / ED Diagnoses   Final diagnoses:  Generalized abdominal pain  Nausea vomiting and diarrhea    ED Discharge Orders    [address]None       Shelby Peltz, [place] Tap[place]ingo[place]cedes, [place] JerseyPA-C 09/29/18 2249    Rolan BuccoBelfi, Melanie, MD 09/29/18 2251

## 2018-09-29 NOTE — ED Triage Notes (Signed)
Pt is from home, lives alone.  C/O n/v/d and epigastric pain that started around 0400 today.  EMS attempted IV for zofran administration w/o success.

## 2018-09-29 NOTE — ED Notes (Signed)
Bed: WA08 Expected date:  Expected time:  Means of arrival:  Comments: 37 yo M/ N/V/Diarrhea

## 2019-08-10 IMAGING — CT CT ABD-PELV W/ CM
2 of 4 series · 17 of 46 positions shown, 19 images · IV contrast (ISOVUE)
Comparison: 08/19/2018, CT 08/18/2018

CLINICAL DATA: Generalized abdominal pain

EXAM:
CT ABDOMEN AND PELVIS WITH CONTRAST
TECHNIQUE: Multidetector CT imaging of the abdomen and pelvis was performed
using the standard protocol following bolus administration of
intravenous contrast.
CONTRAST:  100mL CJX231-VCC IOPAMIDOL (CJX231-VCC) INJECTION 61%

[Series 2: axial st · axial · 0.98mm/px · z∈[-458,-68]mm · 14 of 88 slices shown, 16 images]
[im 5/88  soft-tissue]
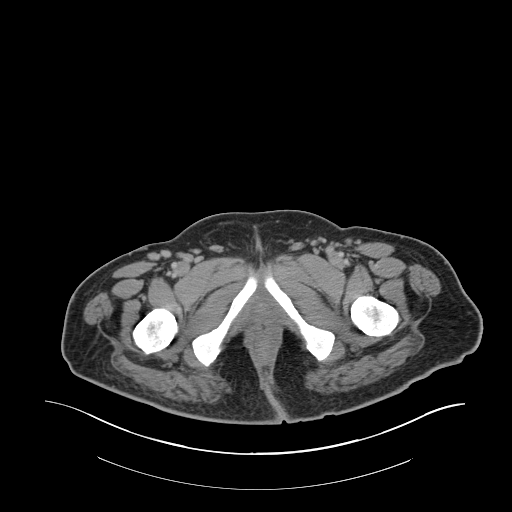
[im 5/88  bone]
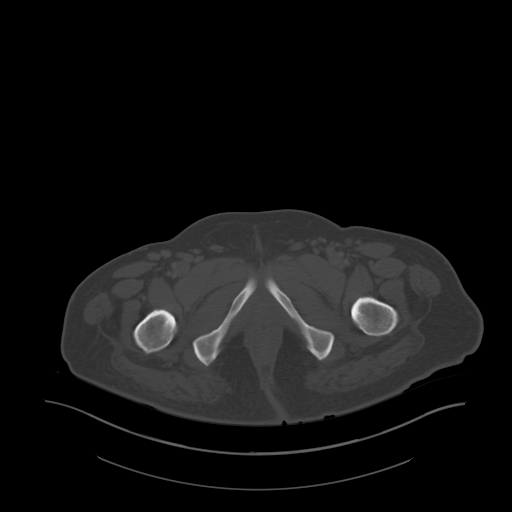
[im 14/88  soft-tissue]
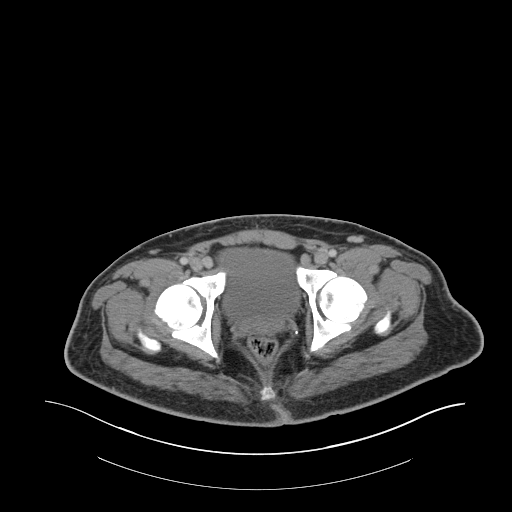
[im 18/88  soft-tissue]
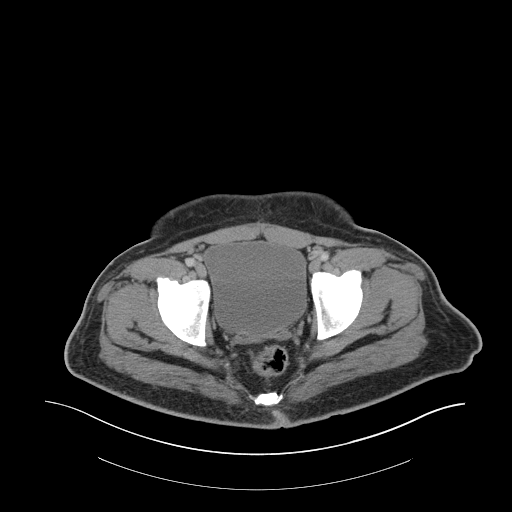
[im 22/88  soft-tissue]
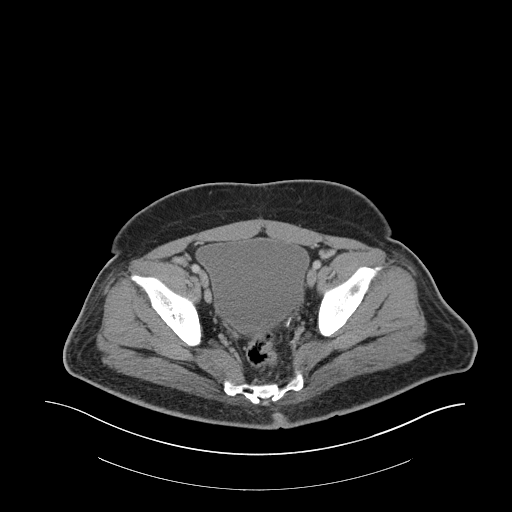
[im 31/88  soft-tissue]
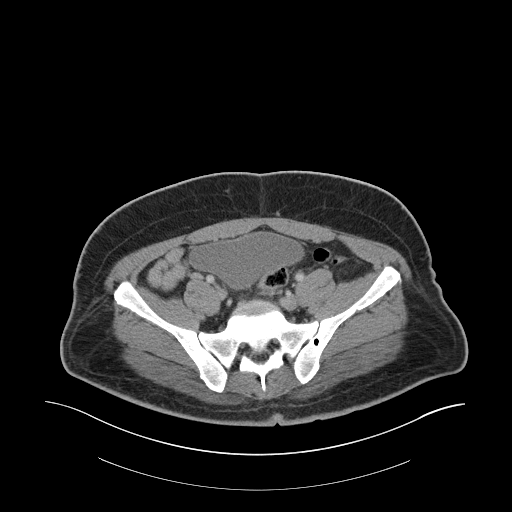
[im 35/88  soft-tissue]
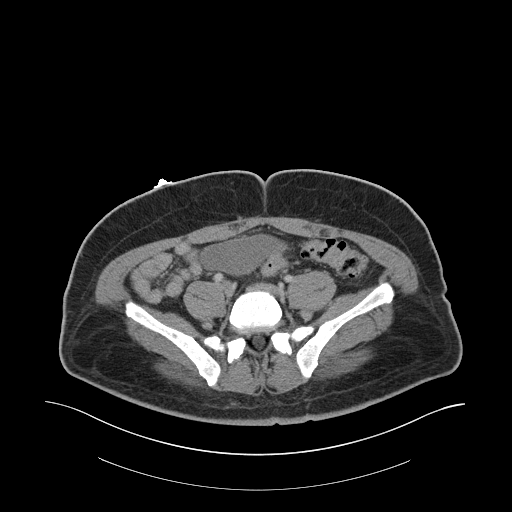
[im 40/88  soft-tissue]
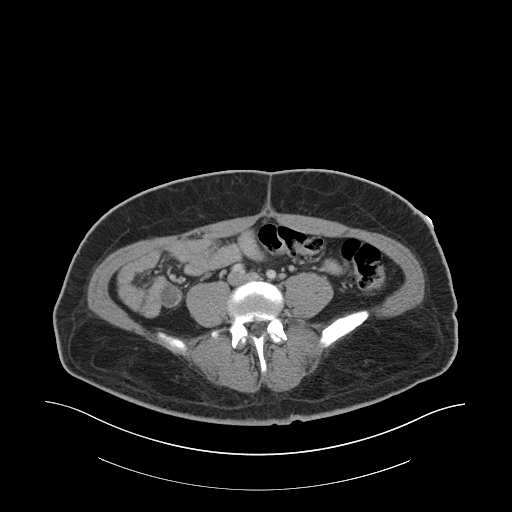
[im 48/88  soft-tissue]
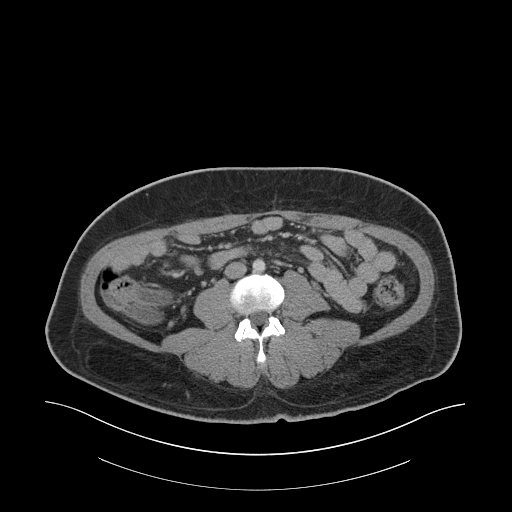
[im 53/88  soft-tissue]
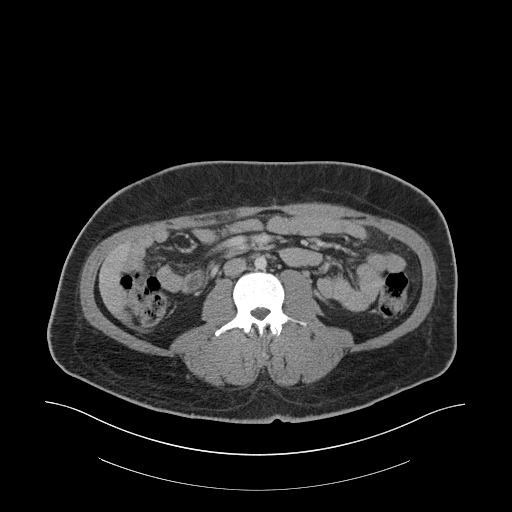
[im 53/88  bone]
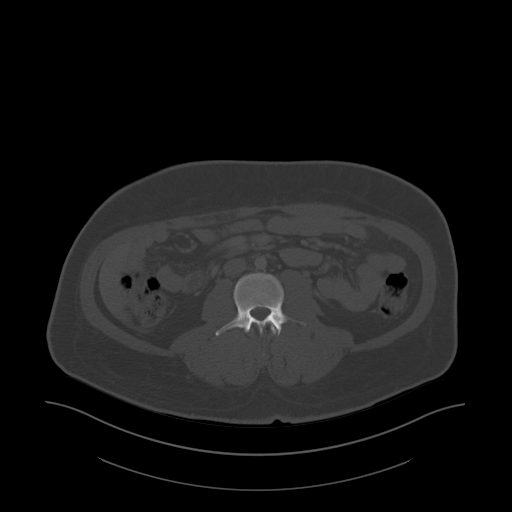
[im 57/88  soft-tissue]
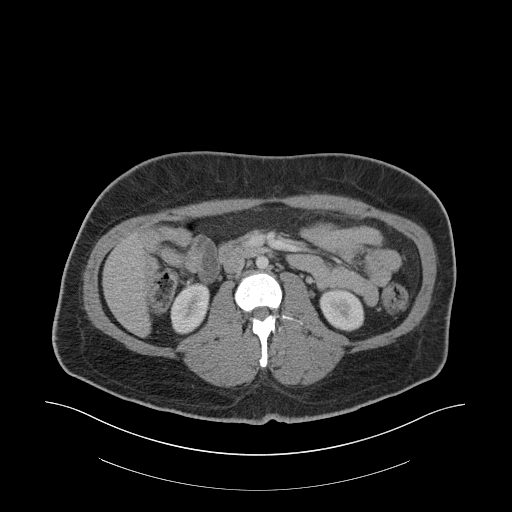
[im 66/88  soft-tissue]
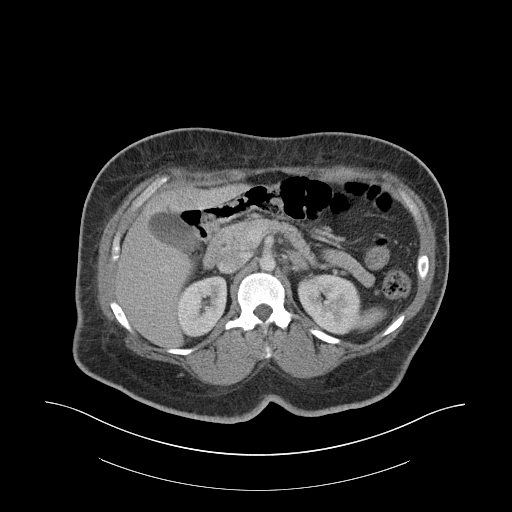
[im 70/88  soft-tissue]
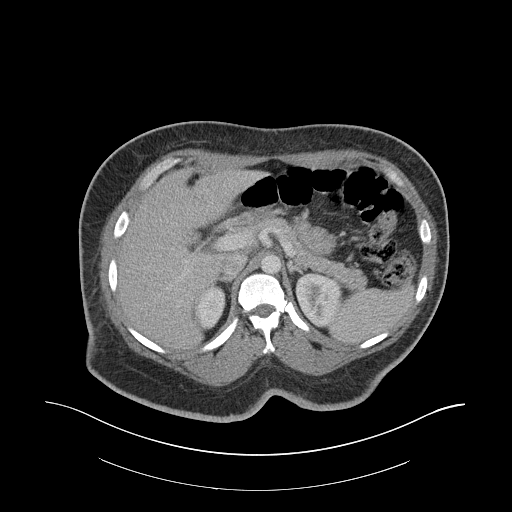
[im 74/88  soft-tissue]
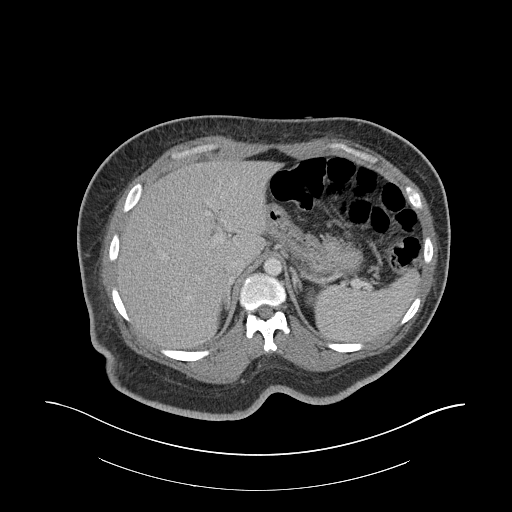
[im 83/88  soft-tissue]
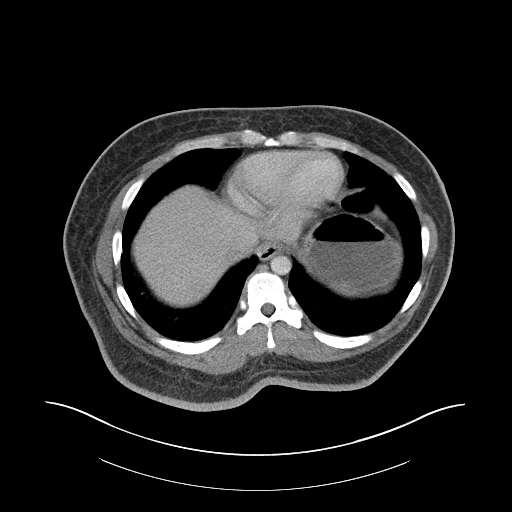

[Series 4: coronal st · coronal · 0.83mm/px · 3 of 113 slices shown]
[im 38/113  soft-tissue]
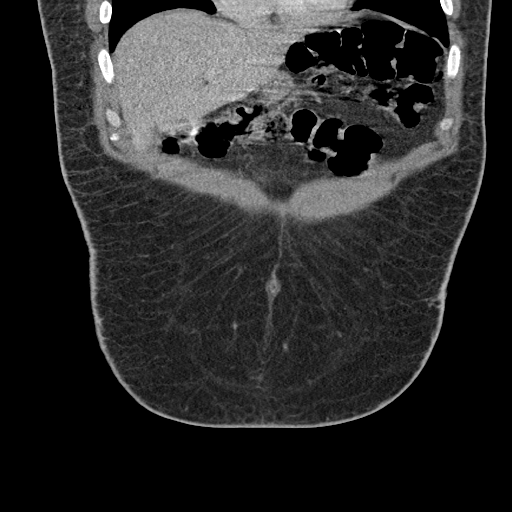
[im 50/113  soft-tissue]
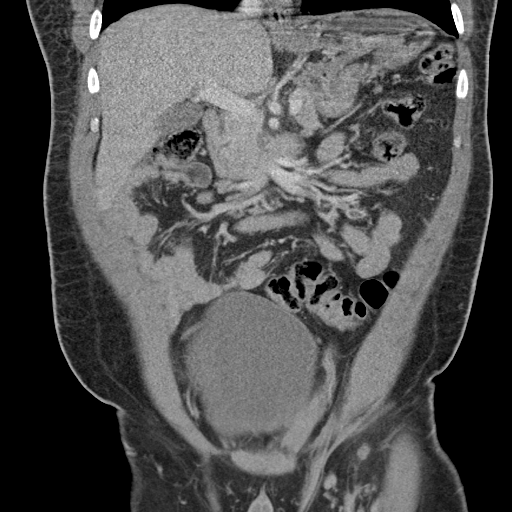
[im 63/113  soft-tissue]
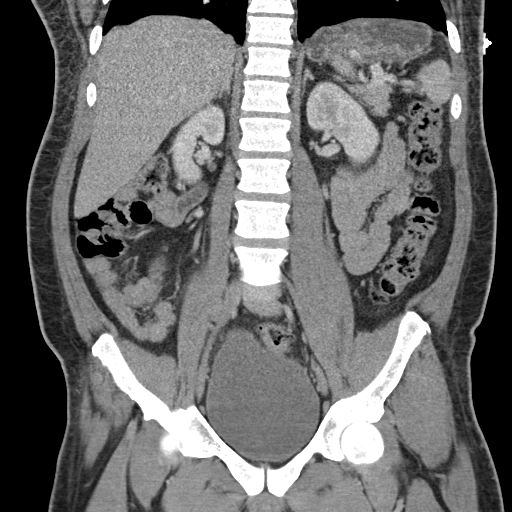

[17 of 46 positions shown; findings below may reference images not displayed]

FINDINGS: Lower chest: No acute consolidation or effusion. The heart size is
within normal limits.

Hepatobiliary: No focal liver abnormality is seen. No gallstones,
gallbladder wall thickening, or biliary dilatation.

Pancreas: Unremarkable. No pancreatic ductal dilatation or
surrounding inflammatory changes.

Spleen: Normal in size without focal abnormality.

Adrenals/Urinary Tract: Adrenal glands are unremarkable. Kidneys are
normal, without renal calculi, focal lesion, or hydronephrosis.
Bladder is unremarkable.

Stomach/Bowel: Stomach is within normal limits. Appendix appears
normal. No evidence of bowel wall thickening, distention, or
inflammatory changes.

Vascular/Lymphatic: No significant vascular findings are present. No
enlarged abdominal or pelvic lymph nodes.

Reproductive: Prostate is unremarkable.

Other: Negative for free air or free fluid

Musculoskeletal: No acute or significant osseous findings.
IMPRESSION: No CT evidence for acute intra-abdominal or pelvic abnormality
